# Patient Record
Sex: Female | Born: 1935 | Race: White | Hispanic: No | Smoking: Current every day smoker
Health system: Southern US, Community
[De-identification: ages and names within clinical notes are randomized; demographics above are authoritative.]

## PROBLEM LIST (undated history)

## (undated) DIAGNOSIS — M7551 Bursitis of right shoulder: Secondary | ICD-10-CM

## (undated) DIAGNOSIS — I10 Essential (primary) hypertension: Secondary | ICD-10-CM

## (undated) DIAGNOSIS — G629 Polyneuropathy, unspecified: Secondary | ICD-10-CM

## (undated) DIAGNOSIS — C801 Malignant (primary) neoplasm, unspecified: Secondary | ICD-10-CM

## (undated) DIAGNOSIS — J449 Chronic obstructive pulmonary disease, unspecified: Secondary | ICD-10-CM

---

## 2004-10-20 ENCOUNTER — Ambulatory Visit: Payer: Self-pay | Admitting: General Surgery

## 2005-11-16 ENCOUNTER — Ambulatory Visit: Payer: Self-pay | Admitting: General Surgery

## 2006-11-17 ENCOUNTER — Ambulatory Visit: Payer: Self-pay | Admitting: General Surgery

## 2006-11-22 ENCOUNTER — Ambulatory Visit: Payer: Self-pay | Admitting: General Surgery

## 2007-11-22 ENCOUNTER — Ambulatory Visit: Payer: Self-pay | Admitting: General Surgery

## 2008-12-27 ENCOUNTER — Ambulatory Visit: Payer: Self-pay | Admitting: General Surgery

## 2010-02-14 ENCOUNTER — Ambulatory Visit: Payer: Self-pay | Admitting: Ophthalmology

## 2010-02-25 ENCOUNTER — Ambulatory Visit: Payer: Self-pay | Admitting: Ophthalmology

## 2010-03-12 ENCOUNTER — Ambulatory Visit: Payer: Self-pay | Admitting: General Surgery

## 2011-01-12 ENCOUNTER — Ambulatory Visit: Payer: Self-pay | Admitting: Orthopedic Surgery

## 2011-01-13 ENCOUNTER — Ambulatory Visit: Payer: Self-pay | Admitting: Orthopedic Surgery

## 2011-02-19 ENCOUNTER — Encounter: Payer: Self-pay | Admitting: Orthopedic Surgery

## 2011-03-10 ENCOUNTER — Encounter: Payer: Self-pay | Admitting: Orthopedic Surgery

## 2011-03-18 ENCOUNTER — Ambulatory Visit: Payer: Self-pay | Admitting: General Surgery

## 2011-08-27 ENCOUNTER — Ambulatory Visit: Payer: Self-pay | Admitting: Orthopedic Surgery

## 2011-10-04 ENCOUNTER — Emergency Department: Payer: Self-pay | Admitting: Emergency Medicine

## 2011-10-24 ENCOUNTER — Emergency Department (HOSPITAL_COMMUNITY): Payer: Medicare Other

## 2011-10-24 ENCOUNTER — Observation Stay (HOSPITAL_COMMUNITY)
Admission: EM | Admit: 2011-10-24 | Discharge: 2011-10-26 | Disposition: A | Discharge status: Home / Self Care | Payer: Medicare Other | Attending: Internal Medicine | Admitting: Internal Medicine

## 2011-10-24 ENCOUNTER — Other Ambulatory Visit: Payer: Self-pay

## 2011-10-24 ENCOUNTER — Encounter: Payer: Self-pay | Admitting: *Deleted

## 2011-10-24 DIAGNOSIS — M79621 Pain in right upper arm: Secondary | ICD-10-CM | POA: Diagnosis present

## 2011-10-24 DIAGNOSIS — M79609 Pain in unspecified limb: Secondary | ICD-10-CM | POA: Insufficient documentation

## 2011-10-24 DIAGNOSIS — R627 Adult failure to thrive: Secondary | ICD-10-CM | POA: Insufficient documentation

## 2011-10-24 DIAGNOSIS — M719 Bursopathy, unspecified: Principal | ICD-10-CM | POA: Insufficient documentation

## 2011-10-24 DIAGNOSIS — M25519 Pain in unspecified shoulder: Secondary | ICD-10-CM | POA: Insufficient documentation

## 2011-10-24 DIAGNOSIS — I10 Essential (primary) hypertension: Secondary | ICD-10-CM | POA: Diagnosis not present

## 2011-10-24 DIAGNOSIS — G629 Polyneuropathy, unspecified: Secondary | ICD-10-CM

## 2011-10-24 DIAGNOSIS — G90511 Complex regional pain syndrome I of right upper limb: Secondary | ICD-10-CM

## 2011-10-24 DIAGNOSIS — J4489 Other specified chronic obstructive pulmonary disease: Secondary | ICD-10-CM | POA: Insufficient documentation

## 2011-10-24 DIAGNOSIS — C439 Malignant melanoma of skin, unspecified: Secondary | ICD-10-CM | POA: Insufficient documentation

## 2011-10-24 DIAGNOSIS — Z9181 History of falling: Secondary | ICD-10-CM | POA: Insufficient documentation

## 2011-10-24 DIAGNOSIS — R6251 Failure to thrive (child): Secondary | ICD-10-CM

## 2011-10-24 DIAGNOSIS — M67919 Unspecified disorder of synovium and tendon, unspecified shoulder: Principal | ICD-10-CM | POA: Insufficient documentation

## 2011-10-24 DIAGNOSIS — G5690 Unspecified mononeuropathy of unspecified upper limb: Secondary | ICD-10-CM | POA: Insufficient documentation

## 2011-10-24 DIAGNOSIS — M7551 Bursitis of right shoulder: Secondary | ICD-10-CM | POA: Diagnosis present

## 2011-10-24 DIAGNOSIS — Z85820 Personal history of malignant melanoma of skin: Secondary | ICD-10-CM | POA: Insufficient documentation

## 2011-10-24 DIAGNOSIS — J449 Chronic obstructive pulmonary disease, unspecified: Secondary | ICD-10-CM | POA: Diagnosis present

## 2011-10-24 HISTORY — DX: Bursitis of right shoulder: M75.51

## 2011-10-24 HISTORY — DX: Essential (primary) hypertension: I10

## 2011-10-24 HISTORY — DX: Malignant (primary) neoplasm, unspecified: C80.1

## 2011-10-24 HISTORY — DX: Polyneuropathy, unspecified: G62.9

## 2011-10-24 HISTORY — DX: Chronic obstructive pulmonary disease, unspecified: J44.9

## 2011-10-24 LAB — CBC
Hemoglobin: 16.2 g/dL — ABNORMAL HIGH (ref 12.0–15.0)
MCHC: 36 g/dL (ref 30.0–36.0)
Platelets: 239 10*3/uL (ref 150–400)
RBC: 4.76 MIL/uL (ref 3.87–5.11)

## 2011-10-24 LAB — COMPREHENSIVE METABOLIC PANEL
ALT: 17 U/L (ref 0–35)
AST: 16 U/L (ref 0–37)
Albumin: 4 g/dL (ref 3.5–5.2)
Alkaline Phosphatase: 77 U/L (ref 39–117)
BUN: 9 mg/dL (ref 6–23)
Chloride: 100 mEq/L (ref 96–112)
Potassium: 3.8 mEq/L (ref 3.5–5.1)
Sodium: 137 mEq/L (ref 135–145)
Total Bilirubin: 0.4 mg/dL (ref 0.3–1.2)

## 2011-10-24 LAB — URINALYSIS, ROUTINE W REFLEX MICROSCOPIC
Ketones, ur: NEGATIVE mg/dL
Leukocytes, UA: NEGATIVE
Protein, ur: NEGATIVE mg/dL
Urobilinogen, UA: 0.2 mg/dL (ref 0.0–1.0)

## 2011-10-24 LAB — DIFFERENTIAL
Basophils Absolute: 0 10*3/uL (ref 0.0–0.1)
Basophils Relative: 0 % (ref 0–1)
Monocytes Relative: 7 % (ref 3–12)
Neutro Abs: 7.2 10*3/uL (ref 1.7–7.7)
Neutrophils Relative %: 65 % (ref 43–77)

## 2011-10-24 LAB — CARDIAC PANEL(CRET KIN+CKTOT+MB+TROPI)
CK, MB: 3 ng/mL (ref 0.3–4.0)
Relative Index: INVALID (ref 0.0–2.5)
Total CK: 42 U/L (ref 7–177)
Troponin I: <0.3 ng/mL (ref <0.30)

## 2011-10-24 MED ORDER — KETOROLAC TROMETHAMINE 30 MG/ML IJ SOLN
30.0000 mg | Freq: Once | INTRAMUSCULAR | Status: AC
  Administered 2011-10-24: 30 mg via INTRAVENOUS
  Filled 2011-10-24: qty 1

## 2011-10-24 MED ORDER — ONDANSETRON HCL 4 MG/2ML IJ SOLN
4.0000 mg | Freq: Once | INTRAMUSCULAR | Status: AC
  Administered 2011-10-24: 4 mg via INTRAVENOUS
  Filled 2011-10-24: qty 2

## 2011-10-24 MED ORDER — MORPHINE SULFATE 2 MG/ML IJ SOLN
2.0000 mg | Freq: Once | INTRAMUSCULAR | Status: DC
  Filled 2011-10-24: qty 1

## 2011-10-24 NOTE — ED Notes (Signed)
Pt to Xray.

## 2011-10-24 NOTE — ED Notes (Signed)
Pt denies decrease in pain, but swelling to R hand is markedly decreased after toradol.  Pt also encouraged to elevated arm.

## 2011-10-24 NOTE — ED Notes (Signed)
Patient with right hand pain that is radiating to her upper right arm.  Patient had lymph node removed many years ago and last year her left arm started hurting on her left arm.  Patient went to her MD and she said that her "MD would not listen to her."  Patient's right arm looks swollen and her palm are red

## 2011-10-24 NOTE — ED Notes (Signed)
Report given and care transferred to [facility], [place].

## 2011-10-24 NOTE — ED Notes (Signed)
Family at bedside. 

## 2011-10-24 NOTE — ED Notes (Signed)
Pt with no improvement in her pain level after toradol.  Dr Adriana Simas notified.

## 2011-10-24 NOTE — Progress Notes (Signed)
*  PRELIMINARY RESULTS*   Right upper extremity venous Doppler completed.  There is no deep or superficial vein thrombosis.  Incidentally, there appears to be adaquate arterial flow throughout the arm to the hand.    [name] Meyer 10/24/2011, 4:56 PM

## 2011-10-24 NOTE — H&P (Signed)
PCP:   Elmo Putt, MD   Chief Complaint:   right upper arm for 6 month   HPI:  77 year old female with history of COPD , right upper arm melanoma sp removal ,she has this chronic pain on her right arm which is achy and pin like sensation sp what it looks like Radial nerve release done on March of this year, but per patient since then she continued to have worsening pain  To the degree pain affect her daily living activities , family has to help her , she lives alone, her pain progressively worsening to the degree cannot sleep at night.  Denies any weakness on her arm  Review of Systems:  The patient denies anorexia, fever, weight loss,, vision loss, decreased hearing, hoarseness, chest pain, syncope,positive for  dyspnea on exertion, peripheral edema,some  balance deficits with 2 falls in the recent past, denies hemoptysis, abdominal pain, melena, hematochezia, severe indigestion/heartburn, hematuria, incontinence, suspicious skin lesions, transient blindness, difficulty walking, depression, unusual weight change, abnormal bleeding, Past Medical History: Past Medical History  Diagnosis Date  . Hypertension    PAST SURGICAL HISTORY:cholecystectomy,partial hysterectomy, Radial nerve release?,melnoma  Medications: Prior to Admission medications   Medication Sig Start Date End Date Taking? Authorizing Provider  acetaminophen (TYLENOL) 500 MG tablet Take 500 mg by mouth every 6 (six) hours as needed. For pain    Yes Historical Provider, MD  benazepril (LOTENSIN) 20 MG tablet Take 10 mg by mouth daily.     Yes Historical Provider, MD  gabapentin (NEURONTIN) 100 MG capsule Take 100 mg by mouth at bedtime.     Yes Historical Provider, MD    Allergies:   Allergies  Allergen Reactions  . Other Nausea And Vomiting and Other (See Comments)    PAIN MEDICATIONS  REACTION: Patient states, "like I'm in another world"  . Penicillins Rash    Social History: on going tobacco abuse for more than  60 years , one pack per day, no drinking, lives alone ,has 3 grown up children Family History: History reviewed. No pertinent family history.  Physical Exam: Filed Vitals:   10/24/11 1557 10/24/11 1740 10/24/11 1819  BP: 167/74 157/81 168/74  Pulse: 95 87 87  Temp: 98 F (36.7 C)  98 F (36.7 C)  TempSrc: Oral  Oral  Resp: 20  20  SpO2: 94% 95% 96%   Shortness of breath. No JVP No lymph node, pupil equally reactive to light and accomodation Heart s1 ans s2 , no added murmur Lung : rales bilateral , no wheezing Abdomin: distended, BS present, no organomegaly Right upper arm : swelling on her fingers with limited wrist flexion , limited flexion of the fingers especially the lateral side, very tender to touch, no masson the armpit. Very limited shoulder movement secondary to pain rather than local weakness.   Labs on Admission:   [facility] 10/24/11 1912  NA 137  K 3.8  CL 100  CO2 26  GLUCOSE PENDING  BUN 9  CREATININE 0.60  CALCIUM 9.4  MG --  PHOS --    Basename 10/24/11 1912  AST 16  ALT 17  ALKPHOS 77  BILITOT 0.4  PROT 7.5  ALBUMIN 4.0   No results found for this basename: LIPASE:2,AMYLASE:2 in the last 72 hours  Basename 10/24/11 1912  WBC 11.1*  NEUTROABS 7.2  HGB 16.2*  HCT 45.0  MCV 94.5  PLT 239    Basename 10/24/11 1912  CKTOTAL 42  CKMB 3.0  CKMBINDEX --  TROPONINI <0.30   No results found for this basename: TSH,T4TOTAL,FREET3,T3FREE,THYROIDAB in the last 72 hours No results found for this basename: VITAMINB12:2,FOLATE:2,FERRITIN:2,TIBC:2,IRON:2,RETICCTPCT:2 in the last 72 hours  Radiological Exams on Admission: No results found.  Assessment/Plan 1- acute on chronic right upper arm pain: as per family previously investigated at [facility] hospital , no obvious cause and felt it could be related to radial nerve entrapments sp release, patient with swelling of her fingers , very sensitive to touch.limted hand flexion , will get record from  [place], ESR, RF, uric acid, MRI of the shoulder and CXR RULE OUT MASS, increase neurontin 100 mg po bid, may need to have nerve studies , please consider consulting hand surgeon in am , check vit B12.will treat supportively for now with ibuprofen and steroid to relieve local inflammation.looks likely secondary to radial nerve entrapments , unlikely carpal tunnel, check TSH, per daughter already have MRI OF cervical and thoraxic spine, will wait official record 2- COPD : NEBS  3-htn: HOME MEDICATION  Daquavion Catala I. 10/24/2011, 11:04 PM

## 2011-10-24 NOTE — ED Notes (Signed)
Family is afraid pt cannot care for herself at home with this kind of pain.  Social services may need to be contacted.

## 2011-10-24 NOTE — ED Notes (Signed)
Pt refused morphine saying it makes her feel" sick and drunk". Dr. Adriana Simas notified.

## 2011-10-24 NOTE — ED Provider Notes (Signed)
History     CSN: [id number] Arrival date & time: 10/24/2011  3:43 PM   First MD Initiated Contact with Patient 10/24/11 1825      Chief Complaint  Patient presents with  . Hand Pain    (Consider location/radiation/quality/duration/timing/severity/associated sxs/prior treatment) HPI..... and right upper extremity for one year getting worse the past 3 days.  Status post excision of lymph node in right axilla years ago. Family reports falling episodes lately and inability to take for your of her self. No chest pain, shortness of breath, fever, chills, dysuria.Marland Kitchen palpation makes pain worse. Level V caveat for urgent need for intervention  Past Medical History  Diagnosis Date  . Hypertension     History reviewed. No pertinent past surgical history.  History reviewed. No pertinent family history.  History  Substance Use Topics  . Smoking status: Never Smoker   . Smokeless tobacco: Not on file  . Alcohol Use: No    OB History    Grav Para Term Preterm Abortions TAB SAB Ect Mult Living                  Review of Systems  Unable to perform ROS   Allergies  Other and Penicillins  Home Medications   Current Outpatient Rx  Name Route Sig Dispense Refill  . ACETAMINOPHEN 500 MG PO TABS Oral Take 500 mg by mouth every 6 (six) hours as needed. For pain     . BENAZEPRIL HCL 20 MG PO TABS Oral Take 10 mg by mouth daily.      Marland Kitchen GABAPENTIN 100 MG PO CAPS Oral Take 100 mg by mouth at bedtime.        BP 168/74  Pulse 87  Temp(Src) 98 F (36.7 C) (Oral)  Resp 20  SpO2 96%  Physical Exam  Nursing note and vitals reviewed. Constitutional: She is oriented to person, place, and time. She appears well-developed and well-nourished.  HENT:  Head: Normocephalic and atraumatic.  Eyes: Conjunctivae and EOM are normal. Pupils are equal, round, and reactive to light.  Neck: Normal range of motion. Neck supple.  Cardiovascular: Normal rate and regular rhythm.   Pulmonary/Chest:  Effort normal and breath sounds normal.  Abdominal: Soft. Bowel sounds are normal.  Musculoskeletal: Normal range of motion.       Right upper extremity: Tender from medial proximal humerus to hand.  Full range of motion. Good pulses. hand warm.  Neurological: She is alert and oriented to person, place, and time.  Skin: Skin is warm and dry.       Bilateral palms are erythematous.  This is an old finding  Psychiatric: She has a normal mood and affect.    ED Course  Procedures (including critical care time)  Labs Reviewed  CBC - Abnormal; Notable for the following:    WBC 11.1 (*)    Hemoglobin 16.2 (*)    All other components within normal limits  COMPREHENSIVE METABOLIC PANEL - Abnormal; Notable for the following:    GFR calc non Af Amer 81 (*)    All other components within normal limits  APTT - Abnormal; Notable for the following:    aPTT 39 (*)    All other components within normal limits  DIFFERENTIAL  PROTIME-INR  CARDIAC PANEL(CRET KIN+CKTOT+MB+TROPI)  URINALYSIS, ROUTINE W REFLEX MICROSCOPIC   No results found.   1. Complex regional pain syndrome of right upper extremity   2. Failure to thrive       MDM  Venous  Doppler obtained which showed no acute findings. On Further examination, it appears that patient is having trouble caring for herself at home.  uncertain etiology of right upper extremity pain.         Donnetta Hutching, MD 10/24/11 2227

## 2011-10-25 ENCOUNTER — Emergency Department (HOSPITAL_COMMUNITY): Payer: Medicare Other

## 2011-10-25 ENCOUNTER — Inpatient Hospital Stay (HOSPITAL_COMMUNITY): Payer: Medicare Other

## 2011-10-25 ENCOUNTER — Encounter (HOSPITAL_COMMUNITY): Payer: Self-pay

## 2011-10-25 DIAGNOSIS — M7551 Bursitis of right shoulder: Secondary | ICD-10-CM | POA: Diagnosis present

## 2011-10-25 LAB — CBC
HCT: 43.3 % (ref 36.0–46.0)
Hemoglobin: 15 g/dL (ref 12.0–15.0)
MCH: 33.2 pg (ref 26.0–34.0)
MCHC: 34.6 g/dL (ref 30.0–36.0)
MCV: 95.6 fL (ref 78.0–100.0)
Platelets: 204 10*3/uL (ref 150–400)
RBC: 4.52 MIL/uL (ref 3.87–5.11)
RDW: 13.1 % (ref 11.5–15.5)
RDW: 13.1 % (ref 11.5–15.5)
WBC: 10.6 10*3/uL — ABNORMAL HIGH (ref 4.0–10.5)

## 2011-10-25 LAB — COMPREHENSIVE METABOLIC PANEL
Albumin: 3.5 g/dL (ref 3.5–5.2)
Alkaline Phosphatase: 72 U/L (ref 39–117)
BUN: 12 mg/dL (ref 6–23)
CO2: 26 mEq/L (ref 19–32)
Chloride: 99 mEq/L (ref 96–112)
Creatinine, Ser: 0.62 mg/dL (ref 0.50–1.10)
GFR calc Af Amer: >90 mL/min (ref >90)
GFR calc non Af Amer: 81 mL/min — ABNORMAL LOW (ref >90)
Glucose, Bld: 106 mg/dL — ABNORMAL HIGH (ref 70–99)
Potassium: 3.8 mEq/L (ref 3.5–5.1)
Total Bilirubin: 0.5 mg/dL (ref 0.3–1.2)

## 2011-10-25 LAB — CARDIAC PANEL(CRET KIN+CKTOT+MB+TROPI)
Relative Index: INVALID (ref 0.0–2.5)
Relative Index: INVALID (ref 0.0–2.5)
Total CK: 42 U/L (ref 7–177)
Total CK: 42 U/L (ref 7–177)
Total CK: 42 U/L (ref 7–177)
Troponin I: <0.3 ng/mL (ref <0.30)

## 2011-10-25 LAB — FERRITIN: Ferritin: 229 ng/mL (ref 10–291)

## 2011-10-25 LAB — VITAMIN B12: Vitamin B-12: 593 pg/mL (ref 211–911)

## 2011-10-25 LAB — RETICULOCYTES
RBC.: 4.52 MIL/uL (ref 3.87–5.11)
Retic Ct Pct: 1 % (ref 0.4–3.1)

## 2011-10-25 LAB — URIC ACID: Uric Acid, Serum: 4.3 mg/dL (ref 2.4–7.0)

## 2011-10-25 LAB — C-REACTIVE PROTEIN: CRP: 0.17 mg/dL — ABNORMAL LOW (ref <0.60)

## 2011-10-25 LAB — TSH: TSH: 1.706 u[IU]/mL (ref 0.350–4.500)

## 2011-10-25 LAB — CREATININE, SERUM: GFR calc non Af Amer: 79 mL/min — ABNORMAL LOW (ref >90)

## 2011-10-25 LAB — IRON AND TIBC: Saturation Ratios: 42 % (ref 20–55)

## 2011-10-25 MED ORDER — HEPARIN SODIUM (PORCINE) 5000 UNIT/ML IJ SOLN
5000.0000 [IU] | Freq: Three times a day (TID) | INTRAMUSCULAR | Status: DC
  Administered 2011-10-25 – 2011-10-26 (×5): 5000 [IU] via SUBCUTANEOUS
  Filled 2011-10-25 (×7): qty 1

## 2011-10-25 MED ORDER — IOHEXOL 300 MG/ML  SOLN
75.0000 mL | Freq: Once | INTRAMUSCULAR | Status: AC | PRN
  Administered 2011-10-25: 75 mL via INTRAVENOUS

## 2011-10-25 MED ORDER — ALBUTEROL SULFATE (5 MG/ML) 0.5% IN NEBU: 2.5000 mg | INHALATION_SOLUTION | Freq: Four times a day (QID) | RESPIRATORY_TRACT | Status: DC | PRN

## 2011-10-25 MED ORDER — ONDANSETRON HCL 4 MG/2ML IJ SOLN
4.0000 mg | Freq: Four times a day (QID) | INTRAMUSCULAR | Status: DC | PRN
  Administered 2011-10-25 (×3): 4 mg via INTRAVENOUS
  Filled 2011-10-25 (×3): qty 2

## 2011-10-25 MED ORDER — IBUPROFEN 400 MG PO TABS
400.0000 mg | ORAL_TABLET | Freq: Four times a day (QID) | ORAL | Status: DC | PRN
  Filled 2011-10-25: qty 1

## 2011-10-25 MED ORDER — IPRATROPIUM BROMIDE 0.02 % IN SOLN: 0.5000 mg | Freq: Four times a day (QID) | RESPIRATORY_TRACT | Status: DC | PRN

## 2011-10-25 MED ORDER — HYDROMORPHONE HCL PF 1 MG/ML IJ SOLN
0.5000 mg | Freq: Four times a day (QID) | INTRAMUSCULAR | Status: DC | PRN
  Administered 2011-10-25 (×3): 0.5 mg via INTRAVENOUS
  Filled 2011-10-25 (×3): qty 1

## 2011-10-25 MED ORDER — GABAPENTIN 100 MG PO CAPS
100.0000 mg | ORAL_CAPSULE | Freq: Two times a day (BID) | ORAL | Status: DC
  Administered 2011-10-25 – 2011-10-26 (×3): 100 mg via ORAL
  Filled 2011-10-25 (×4): qty 1

## 2011-10-25 MED ORDER — KETOROLAC TROMETHAMINE 30 MG/ML IJ SOLN
30.0000 mg | Freq: Two times a day (BID) | INTRAMUSCULAR | Status: AC
  Administered 2011-10-25: 30 mg via INTRAVENOUS
  Filled 2011-10-25 (×2): qty 1

## 2011-10-25 MED ORDER — METHYLPREDNISOLONE SODIUM SUCC 125 MG IJ SOLR
60.0000 mg | Freq: Two times a day (BID) | INTRAMUSCULAR | Status: DC
  Administered 2011-10-25: 60 mg via INTRAVENOUS
  Filled 2011-10-25 (×3): qty 0.96
  Filled 2011-10-25: qty 2

## 2011-10-25 MED ORDER — BENAZEPRIL HCL 10 MG PO TABS
10.0000 mg | ORAL_TABLET | Freq: Every day | ORAL | Status: DC
  Administered 2011-10-25 – 2011-10-26 (×2): 10 mg via ORAL
  Filled 2011-10-25 (×2): qty 1

## 2011-10-25 MED ORDER — ACETAMINOPHEN 500 MG PO TABS
500.0000 mg | ORAL_TABLET | Freq: Four times a day (QID) | ORAL | Status: DC | PRN
Start: 2011-10-25 — End: 2011-10-26
  Administered 2011-10-26: 500 mg via ORAL
  Filled 2011-10-25: qty 1

## 2011-10-25 NOTE — ED Notes (Signed)
Awaiting CT

## 2011-10-25 NOTE — ED Notes (Signed)
Patient transported to CT 

## 2011-10-25 NOTE — Progress Notes (Signed)
Patient ID: [name] Meyer, female   DOB: 1925/02/02, 77 y.o.   MRN: [id number] Subjective: No events overnight. Patient denies chest pain, shortness of breath, abdominal pain.   Objective:  Vital signs in last 24 hours:  Filed Vitals:   10/24/11 1819 10/24/11 2200 10/25/11 0140 10/25/11 0630  BP: 168/74 143/69 160/80 133/71  Pulse: 87 83 84 89  Temp: 98 F (36.7 C)  98 F (36.7 C) 97.1 F (36.2 C)  TempSrc: Oral     Resp: 20  16 18   SpO2: 96% 93% 93% 95%    Intake/Output from previous day:   Intake/Output Summary (Last 24 hours) at 10/25/11 1101 Last data filed at 10/25/11 0700  Gross per 24 hour  Intake    240 ml  Output      0 ml  Net    240 ml    Physical Exam: General: Alert, awake, oriented x to self and place, in no acute distress. HEENT: No bruits, no goiter. Moist mucous membranes, no scleral icterus, no conjunctival pallor. Heart: Regular rate and rhythm, without murmurs, rubs, gallops. Lungs: Clear to auscultation bilaterally. No wheezing, no rhonchi, no rales.  Abdomen: Soft, nontender, nondistended, positive bowel sounds. Extremities: Profound pain over her right upper extremity; no lower extremity edema, pulses palpable Neuro: Grossly intact, nonfocal.    Lab Results:  Basic Metabolic Panel:    Component Value Date/Time   NA 136 10/25/2011 0345   K 3.8 10/25/2011 0345   CL 99 10/25/2011 0345   CO2 26 10/25/2011 0345   BUN 12 10/25/2011 0345   CREATININE 0.65 10/25/2011 0345   CREATININE 0.62 10/25/2011 0345   GLUCOSE 106* 10/25/2011 0345   CALCIUM 9.2 10/25/2011 0345   CBC:    Component Value Date/Time   WBC 10.7* 10/25/2011 0345   WBC 10.6* 10/25/2011 0345   HGB 15.0 10/25/2011 0345   HGB 14.7 10/25/2011 0345   HCT 43.3 10/25/2011 0345   HCT 43.2 10/25/2011 0345   PLT 211 10/25/2011 0345   PLT 204 10/25/2011 0345   MCV 95.8 10/25/2011 0345   MCV 95.6 10/25/2011 0345   NEUTROABS 7.2 10/24/2011 1912   LYMPHSABS 3.1 10/24/2011 1912   MONOABS 0.8 10/24/2011 1912   EOSABS 0.0 10/24/2011 1912   BASOSABS 0.0 10/24/2011 1912      Lab 10/25/11 0345 10/24/11 1912  WBC 10.7*10.6* 11.1*  HGB 15.014.7 16.2*  HCT 43.343.2 45.0  PLT 211204 239  MCV 95.895.6 94.5  MCH 33.232.5 34.0  MCHC 34.634.0 36.0  RDW 13.113.1 13.0  LYMPHSABS -- 3.1  MONOABS -- 0.8  EOSABS -- 0.0  BASOSABS -- 0.0  BANDABS -- --    Lab 10/25/11 0345 10/24/11 1912  NA 136 137  K 3.8 3.8  CL 99 100  CO2 26 26  GLUCOSE 106* 99  BUN 12 9  CREATININE 0.650.62 0.60  CALCIUM 9.2 9.4  MG -- --    Lab 10/24/11 1912  INR 1.03  PROTIME --   Cardiac markers:  Lab 10/25/11 0925 10/25/11 0345 10/24/11 1912  CKMB 2.9 3.0 3.0  TROPONINI <0.30 <0.30 <0.30  MYOGLOBIN -- -- --   No components found with this basename: POCBNP:3 No results found for this or any previous visit (from the past 240 hour(s)).  Studies/Results: Dg Chest 2 View  10/24/2011 IMPRESSION: Vague  right upper lobe density may represent pneumonia.  Scarring and mass lesion are other possibilities.  Follow-up chest x-ray is suggested.   Ct Chest W Contrast  10/25/2011   IMPRESSION: Centrolobular emphysematous changes.  5 mm nodule along the minor fissure is favored to represent a benign lymph node. However, a follow-up chest CT at 6-12 months is recommended to document stability.  This recommendation follows the consensus statement: Guidelines for Management of Small Pulmonary Nodules Detected on CT Scans: A Statement from the Fleischner Society as published in Radiology 2005; 237:395-400. Online at: DietDisorder.cz.     Medications: Scheduled Meds:   . benazepril  10 mg Oral Daily  . gabapentin  100 mg Oral BID  . heparin  5,000 Units Subcutaneous Q8H  . ketorolac  30 mg Intravenous Once  . ketorolac  30 mg Intravenous BID  . methylPREDNISolone (SOLU-MEDROL) injection  60 mg Intravenous Q12H  . ondansetron  4 mg Intravenous  Once  . DISCONTD:  morphine injection  2 mg Intravenous Once   Continuous Infusions:  PRN Meds:.acetaminophen, albuterol, HYDROmorphone (DILAUDID) injection, ibuprofen, iohexol, ipratropium, ondansetron  Assessment/Plan:  Principal Problem:  *Pain of right upper arm - he is on patient history this is to be a chronic problem since 1970s; at present we are awaiting the results of MRI of the shoulder; orthopedics was contacted for her input on management and was recommended this is a chronic problem there is really not much to do except to try to obtain the records from wherever it did surgery in March ( questionable release of nerve entrapment)  Active Problems:   HTN (hypertension) - continue benazepril    COPD (chronic obstructive pulmonary disease) - stable at present, patient is saturating well on room air, continue nebulizer treatments as necessary    Neuropathy - unclear etiology, continue gabapentin    LOS: 1 day   [name] 10/25/2011, 11:01 AM

## 2011-10-26 MED ORDER — VITAMIN D (ERGOCALCIFEROL) 1.25 MG (50000 UNIT) PO CAPS
50000.0000 [IU] | ORAL_CAPSULE | ORAL | Status: DC
  Administered 2011-10-26: 50000 [IU] via ORAL
  Filled 2011-10-26: qty 1

## 2011-10-26 NOTE — Progress Notes (Signed)
Full assessment placed in chart. PT recommending HH or 24 hour care. Pt declining SNF. Dtr in room as well and agreeable to [facility]. CSW made CM aware. CSW is signing off. [place], [place] [phone]

## 2011-10-26 NOTE — Progress Notes (Signed)
MEDICARE-CERTIFIED HOME HEALTH AGENCIES [place]  Agencies that are Medicare-Certified and are affiliated with [facility]  Home Health Agency  Telephone Number Address  [facility]  [facility] has ownership interest in this company; however, you are under no obligation to use this agency. [phone] or [phone] [address] [place], [place]  [address]    Agencies that are Medicare-Certified and are not affiliated with [facility]   Home Health Agency  Telephone Number Address  [facility] [phone] Fax [phone] [address] [place], [place]  [address]  [facility] [phone] [address] [place], [place] [address]  [facility] [phone] [address] [place], [place]  [address]  [facility]  [phone] [address] [place], [place] [address]  [facility]  [phone] Fax [phone] [address] [place], [place]  [address]  [facility] [phone] or [phone] [address] [place], [place] [address]  [facility] [phone] [address] [place], [place] [address]  [facility] [phone]  [address] [place], [place] [address]  [facility] [phone] or [phone]  [address] [place], [place]  [address]  [facility] [phone] Fax [phone] [address] [place] [address] [place], [place]  [address]  [facility]  [phone]  Fax [phone] [address], [address] [place],   [address]    

## 2011-10-26 NOTE — Progress Notes (Signed)
Physical Therapy Evaluation Patient Details Name: [name] Meyer MRN: [id number] DOB: 07-22-1925 Today's Date: 10/26/2011  Problem List:  Patient Active Problem List  Diagnoses  . HTN (hypertension)  . Pain of right upper arm  . Melanoma  . COPD (chronic obstructive pulmonary disease)  . Neuropathy  . Bursitis of shoulder, right    Past Medical History:  Past Medical History  Diagnosis Date  . Hypertension   . Cancer   . Bursitis of right shoulder   . COPD (chronic obstructive pulmonary disease)   . Neuropathy    Past Surgical History: History reviewed. No pertinent past surgical history.  PT Assessment/Plan/Recommendation PT Assessment Clinical Impression Statement: Pt presents with a medical diagnosis of right upper extremity pain and swelling along with the following impairments/deficits and therapy diagnosis listed below. Pt presents as a fall risk. Educated pt on importance of an assistive device for safety at home, pt refused.  PT Recommendation/Assessment: Patient will need skilled PT in the acute care venue PT Problem List: Decreased balance;Decreased safety awareness;Pain Barriers to Discharge: Decreased caregiver support Barriers to Discharge Comments: family visits intermittently PT Therapy Diagnosis : Difficulty walking PT Plan PT Frequency: Min 3X/week PT Treatment/Interventions: DME instruction;Gait training;Stair training;Functional mobility training;Therapeutic activities;Therapeutic exercise;Balance training;Patient/family education PT Recommendation Follow Up Recommendations: Home health PT;24 hour supervision/assistance Equipment Recommended: Other (comment) (pt refused equipment) PT Goals  Acute Rehab PT Goals PT Goal Formulation: With patient Time For Goal Achievement: 7 days Pt will go Supine/Side to Sit: with modified independence PT Goal: Supine/Side to Sit - Progress: Progressing toward goal Pt will go Sit to Supine/Side: with modified  independence PT Goal: Sit to Supine/Side - Progress: Progressing toward goal Pt will go Sit to Stand: with modified independence PT Goal: Sit to Stand - Progress: Progressing toward goal Pt will go Stand to Sit: with modified independence PT Goal: Stand to Sit - Progress: Progressing toward goal Pt will Transfer Bed to Chair/Chair to Bed: with modified independence PT Transfer Goal: Bed to Chair/Chair to Bed - Progress: Progressing toward goal Pt will Ambulate: >150 feet;with supervision;with least restrictive assistive device PT Goal: Ambulate - Progress: Progressing toward goal Pt will Go Up / Down Stairs: 1-2 stairs;with supervision PT Goal: Up/Down Stairs - Progress: Progressing toward goal Pt will Perform Home Exercise Program: Independently PT Goal: Perform Home Exercise Program - Progress: Progressing toward goal  PT Evaluation Precautions/Restrictions  Restrictions Weight Bearing Restrictions: No Prior Functioning  Home Living Lives With: Alone Receives Help From: Family (children) Type of Home: House Home Layout: One level Home Access: Stairs to enter Entrance Stairs-Rails: Can reach both Entrance Stairs-Number of Steps: 4 Bathroom Shower/Tub: Tub/shower unit (does not use because she cannot transfer) Bathroom Toilet: Standard Home Adaptive Equipment: None Prior Function Level of Independence: Independent with basic ADLs;Independent with homemaking with ambulation;Independent with gait;Independent with transfers Able to Take Stairs?: Yes Driving: No Vocation: Retired Producer, television/film/video: Awake/alert Overall Cognitive Status: Appears within functional limits for tasks assessed Sensation/Coordination Sensation Light Touch: Appears Intact Extremity Assessment RLE Assessment RLE Assessment: Within Functional Limits LLE Assessment LLE Assessment: Within Functional Limits Mobility (including Balance) Bed Mobility Bed Mobility:  No Transfers Transfers: Yes Sit to Stand: 4: Min assist;With upper extremity assist;From chair/3-in-1;From bed (LUE assist only) Sit to Stand Details (indicate cue type and reason): VC for hand placement for safety. Assist for stability into standing Stand to Sit: 4: Min assist;With upper extremity assist;To chair/3-in-1 (LUE assist only) Stand to Sit Details: VC for hand placement. Controlled  descent Ambulation/Gait Ambulation/Gait: Yes Ambulation/Gait Assistance: 3: Mod assist Ambulation/Gait Assistance Details (indicate cue type and reason): VC for proper sequencing. Pt with slight loss of balance thruoghout ambulation. While observing gait in room, pt held onto all furniture and walls for stability. Educated pt on importance of an assitive device and discussed options with her. Pt refused all assistive devices  Ambulation Distance (Feet): 200 Feet Assistive device: 1 person hand held assist (pt refused AD) Gait Pattern: Trunk flexed Gait velocity: Slow cadence Stairs: Yes Stairs Assistance: 3: Mod assist Stairs Assistance Details (indicate cue type and reason): Mod assist for stability Stair Management Technique: No rails;Forwards Number of Stairs: 2   Balance Balance Assessed: Yes Dynamic Standing Balance Dynamic Standing - Balance Support: Left upper extremity supported Dynamic Standing - Level of Assistance: 3: Mod assist Dynamic Standing - Balance Activities: Other (comment) (Ambulation) Dynamic Standing - Comments: Pt with slight loss of balance with movement. Discussed AD for fall risk, pt refused. Exercise    End of Session PT - End of Session Equipment Utilized During Treatment: Gait belt Activity Tolerance: Patient tolerated treatment well Patient left: in chair;with call bell in reach Nurse Communication: Mobility status for transfers;Mobility status for ambulation General Behavior During Session: [facility] for tasks performed Cognition: [facility] for tasks performed  Milana Kidney 10/26/2011, 11:53 AM  10/26/2011 Milana Kidney DPT PAGER: [phone] OFFICE: [phone]

## 2011-10-26 NOTE — Progress Notes (Signed)
   CARE MANAGEMENT NOTE 10/26/2011  Patient:  [name] Meyer, [name] Meyer   Account Number:  [id number]  Date Initiated:  10/26/2011  Documentation initiated by:  [name] Meyer Assessment:   77 yr-old female adm with acute on chronic (R) upper arm pain; lives alone, two daughters live in area and are supportive.     Anticipated DC Date:  10/26/2011   Anticipated DC Plan:  HOME W HOME HEALTH SERVICES      DC Planning Services  CM consult      [facility] Choice  HOME HEALTH   Choice offered to / List presented to:  [name]        HH arranged  HH-2 PT  HH-3 OT      [facility] agency  [facility]   Status of service:  Completed, signed off  Discharge Disposition:  HOME W HOME HEALTH SERVICES  Comments:  PCP:  Dr. Yates Decamp Meyer  10/26/11 1430 [name] Feltman RN MSN CCM PT/OT recommend home health PT, OT.  Discussed with pt who agrees to services.  Provided list of [facility] agencies, referral made per pt choice, list of agencies placed in chart.

## 2011-10-26 NOTE — Progress Notes (Signed)
Utilization Review Completed.Delecia Vastine T12/17/2012   

## 2011-10-26 NOTE — Progress Notes (Signed)
Occupational Therapy Evaluation Patient Details Name: [name] Meyer MRN: [id number] DOB: Dec 21, 1924 Today's Date: 10/26/2011  Problem List:  Patient Active Problem List  Diagnoses  . HTN (hypertension)  . Pain of right upper arm  . Melanoma  . COPD (chronic obstructive pulmonary disease)  . Neuropathy  . Bursitis of shoulder, right    Past Medical History:  Past Medical History  Diagnosis Date  . Hypertension   . Cancer   . Bursitis of right shoulder   . COPD (chronic obstructive pulmonary disease)   . Neuropathy    Past Surgical History: History reviewed. No pertinent past surgical history.  OT Assessment/Plan/Recommendation OT Assessment Clinical Impression Statement: Pt will benefit from acute OT. Pt with decreased I with ADLs/functional transfers due to pain and decreased balance.   OT Recommendation/Assessment: Patient will need skilled OT in the acute care venue OT Problem List: Decreased activity tolerance;Impaired balance (sitting and/or standing);Decreased coordination;Decreased safety awareness;Decreased knowledge of use of DME or AE;Pain;Impaired UE functional use OT Therapy Diagnosis : Acute pain OT Plan OT Frequency: Min 2X/week OT Treatment/Interventions: Self-care/ADL training;DME and/or AE instruction;Therapeutic activities;Patient/family education;Balance training OT Recommendation Follow Up Recommendations: Home health OT Equipment Recommended: None recommended by OT Individuals Consulted Consulted and Agree with Results and Recommendations: Patient OT Goals Acute Rehab OT Goals OT Goal Formulation: With patient Time For Goal Achievement: 7 days ADL Goals Pt Will Perform Upper Body Dressing: with modified independence;Sitting, bed;Sitting, chair;Unsupported ADL Goal: Upper Body Dressing - Progress: Other (comment) Pt Will Perform Lower Body Dressing: with modified independence;Sit to stand from chair;Sit to stand from bed;Unsupported ADL Goal: Lower  Body Dressing - Progress: Other (comment) Pt Will Transfer to Toilet: with modified independence;Ambulation;Regular height toilet ADL Goal: Toilet Transfer - Progress: Other (comment)  OT Evaluation Precautions/Restrictions  Restrictions Weight Bearing Restrictions: No Prior Functioning Home Living Lives With: Alone Receives Help From: Family (children) Type of Home: House Home Layout: One level Home Access: Stairs to enter Entrance Stairs-Rails: Can reach both Entrance Stairs-Number of Steps: 4 Bathroom Shower/Tub: Tub/shower unit (does not use because she cannot transfer) Bathroom Toilet: Standard Home Adaptive Equipment: None Prior Function Level of Independence: Independent with basic ADLs;Independent with homemaking with ambulation;Independent with gait;Independent with transfers Able to Take Stairs?: Yes Driving: No Vocation: Retired ADL ADL Eating/Feeding: Simulated;Set up Where Assessed - Eating/Feeding: Chair Grooming: Performed;Wash/dry hands;Modified independent Where Assessed - Grooming: Standing at sink Toilet Transfer: Performed;Moderate assistance Toilet Transfer Details (indicate cue type and reason): Mod assist for safety as pt demonstrated loss of balance while ambulating throughout room.   Toilet Transfer Method: Proofreader: Regular height toilet Toileting - Clothing Manipulation: Performed;Modified independent Where Assessed - Toileting Clothing Manipulation: Sit to stand from 3-in-1 or toilet Toileting - Hygiene: Performed;Modified independent Where Assessed - Toileting Hygiene: Sit on 3-in-1 or toilet Equipment Used: Other (comment) ADL Comments: Pt performed ADLs without use of RUE due to pain. Vision/Perception    Cognition Cognition Arousal/Alertness: Awake/alert Overall Cognitive Status: Appears within functional limits for tasks assessed Sensation/Coordination Sensation Light Touch: Appears Intact Additional  Comments: Pt with c/o "pins and needles through entire RUE" Coordination Fine Motor Movements are Fluid and Coordinated: No Extremity Assessment RUE Assessment RUE Assessment: Exceptions to [facility] RUE Strength RUE Overall Strength: Unable to assess;Due to pain LUE Assessment LUE Assessment: Within Functional Limits Mobility  Bed Mobility Bed Mobility: No Transfers Transfers: Yes Sit to Stand: 4: Min assist;With upper extremity assist;From chair/3-in-1;From bed Sit to Stand Details (indicate cue type and reason):  vc for hand placement for safety. assist for stability into standing. Stand to Sit: 4: Min assist;With upper extremity assist;To chair/3-in-1 Stand to Sit Details: vc for hand placement. controlled descent Exercises   End of Session OT - End of Session Activity Tolerance: Treatment limited secondary to agitation (Pt unwilling to participate in session due to desire to leav) Patient left: in chair;with call bell in reach General Behavior During Session: [facility] for tasks performed Cognition: [facility] for tasks performed   Cipriano Mile 10/26/2011, 12:22 PM  10/26/2011 Cipriano Mile OTR/L Pager [phone] Office [phone]

## 2011-10-26 NOTE — Progress Notes (Addendum)
Pt admitted over the weekend for R arm/shoulder and hand pain.  ?? RUE bursiits. Pt has decreased ROM of RUE related to pain. +cms. Pt ambulates with unsteady gait. Pt noted to be up in room without assistance. Pt instructed due to her unsteadiness, she is not to be up in room without assistanceto prevent possibility of falls. Pt  Currently sitting in recliner. Pt reinstructed call bell usage. Pt is alert and oriented x 3 but forgetful. Pt is not cooperative.  Pt noted to occasionally curse staff. Pt states, "I don't care. I am going home". Pt is waiting to be seen via orthopedic MD regarding RUE pain. Pt instructed while she is in the hospital to please call for assistance up in room to prevent falls. Pt verb understanding and agrees to comply. Pt offered a Recruitment consultant. Pt states, "I will call"  Cause I don't want a sitter. Pt repts that she has a past medical history of urinary incontinence or "dribbling of urine". Pt repts that it is "normal" for her to dribble urine and she wears pad. Pt, per rept, has a past medical history of COPD, chronic shoulder R pain, and neuropathy. Pt rates her RUE pain a "10" on scale of 1-10. Pt refuses pain medications. Pt paranoid about anything pill form staff is giving her. Pt offered Tylenol if she "doesn't like how the narcotics make her her feel" .  Pt refuses Tylenol. Pt states, "That won't help me." Pt bil hands noted to be red. Pt states, "My hands are always red." Lungs CTA but noted to be diminished throughout. Pt repts nonprod cough. Pt denies cardiac or resp distress and no s/sx of such. Vital signs stable. Pt repts intermittent nausea but no vomiting yesterday that was relieved with anti emetics per order yesterday. Pt denies nausea or vomiting today. Pt repts that she occasionally is SOB with increased pain. Pt repts, "I have been like that." Pt can turn self and floats heels. Pt repts LBM 12/15. Pt repts passing gas and tolerates diet "fair".

## 2011-10-26 NOTE — Discharge Summary (Signed)
PCP:  Elmo Putt, MD   DOA:  10/24/2011  3:43 PM  Chief Complaint:  Pain in right shoulder  HPI: Patient is an elderly female with history of neuropathy, chronic pain in the right shoulder with unknown etiology who came [place] to [facility] for profound pain in the right shoulder. As per patient pain has been there for more than 30 years and no one could find out what is the etiology. Pain is described as needle prick on the fingertips and occasionally the same feeling throughout the whole arm. She has never experienced these symptoms in left arm. Patient had nerve entrapment and nerve released by Dr. Rosita Kea as per patient in march, 2012.  Allergies: Allergies  Allergen Reactions  . Other Nausea And Vomiting and Other (See Comments)    PAIN MEDICATIONS  REACTION: Patient states, "like I'm in another world"  . Penicillins Rash    Prior to Admission medications   Medication Sig Start Date End Date Taking? Authorizing Provider  acetaminophen (TYLENOL) 500 MG tablet Take 500 mg by mouth every 6 (six) hours as needed. For pain    Yes Historical Provider, MD  benazepril (LOTENSIN) 20 MG tablet Take 10 mg by mouth daily.     Yes Historical Provider, MD  gabapentin (NEURONTIN) 100 MG capsule Take 100 mg by mouth at bedtime.     Yes Historical Provider, MD    Past Medical History  Diagnosis Date  . Hypertension   . Cancer   . Bursitis of right shoulder   . COPD (chronic obstructive pulmonary disease)   . Neuropathy     History reviewed. No pertinent past surgical history.  Social History:  reports that she has been smoking.  She does not have any smokeless tobacco history on file. She reports that she does not drink alcohol or use illicit drugs.  History reviewed. No pertinent family history.  Review of Systems:  Constitutional: Denies fever, chills, diaphoresis, appetite change and fatigue.  HEENT: Denies photophobia, eye pain, redness, hearing loss, ear pain,  congestion, sore throat, rhinorrhea, sneezing, mouth sores, trouble swallowing, neck pain, neck stiffness and tinnitus.   Respiratory: Denies SOB, DOE, cough, chest tightness,  and wheezing.   Cardiovascular: Denies chest pain, palpitations and leg swelling.  Gastrointestinal: Denies nausea, vomiting, abdominal pain, diarrhea, constipation, blood in stool and abdominal distention.  Genitourinary: Denies dysuria, urgency, frequency, hematuria, flank pain and difficulty urinating.  Musculoskeletal: Denies myalgias, back pain, joint swelling, arthralgias and gait problem; pain in right shoulder Skin: Denies pallor, rash and wound.  Neurological: Denies dizziness, seizures, syncope, weakness, light-headedness, numbness and headaches.  Hematological: Denies adenopathy. Easy bruising, personal or family bleeding history  Psychiatric/Behavioral: Denies suicidal ideation, mood changes, confusion, nervousness, sleep disturbance and agitation   Physical Exam:  Filed Vitals:   10/25/11 0630 10/25/11 1400 10/25/11 2150 10/26/11 0640  BP: 133/71 130/70 145/68 136/70  Pulse: 89 86 81 86  Temp: 97.1 F (36.2 C) 98.8 F (37.1 C) 98.2 F (36.8 C) 98.5 F (36.9 C)  TempSrc:  Oral    Resp: 18 18 16 16   SpO2: 95% 90% 92% 94%    Constitutional: Vital signs reviewed.  Patient is a well-developed and well-nourished in no acute distress and cooperative with exam. Alert and oriented x3.  Head: Normocephalic and atraumatic Ear: TM normal bilaterally Mouth: no erythema or exudates, MMM Eyes: PERRL, EOMI, conjunctivae normal, No scleral icterus.  Neck: Supple, Trachea midline normal ROM, No JVD, mass, thyromegaly, or carotid bruit  present.  Cardiovascular: RRR, S1 normal, S2 normal, no MRG, pulses symmetric and intact bilaterally Pulmonary/Chest: CTAB, no wheezes, rales, or rhonchi Abdominal: Soft. Non-tender, non-distended, bowel sounds are normal, no masses, organomegaly, or guarding present.  GU: no CVA  tenderness Musculoskeletal: No joint deformities, erythema, or stiffness, ROM full and no nontender Ext: no edema and no cyanosis, pulses palpable bilaterally (DP and PT); pain in the right shoulder Hematology: no cervical, inginal, or axillary adenopathy.  Neurological: A&O x3, Strenght is normal and symmetric bilaterally, cranial nerve II-XII are grossly intact, no focal motor deficit, sensory intact to light touch bilaterally.  Skin: Warm, dry and intact. No rash, cyanosis, or clubbing.  Psychiatric: Normal mood and affect. speech and behavior is normal. Judgment and thought content normal. Cognition and memory are normal.   Labs on Admission:  Results for orders placed during the hospital encounter of 10/24/11 (from the past 48 hour(s))  CBC     Status: Abnormal   Collection Time   10/24/11  7:12 PM      Component Value Range Comment   WBC 11.1 (*) 4.0 - 10.5 (K/uL)    RBC 4.76  3.87 - 5.11 (MIL/uL)    Hemoglobin 16.2 (*) 12.0 - 15.0 (g/dL)    HCT 96.0  45.4 - 09.8 (%)    MCV 94.5  78.0 - 100.0 (fL)    MCH 34.0  26.0 - 34.0 (pg)    MCHC 36.0  30.0 - 36.0 (g/dL)    RDW 11.9  14.7 - 82.9 (%)    Platelets 239  150 - 400 (K/uL)   DIFFERENTIAL     Status: Normal   Collection Time   10/24/11  7:12 PM      Component Value Range Comment   Neutrophils Relative 65  43 - 77 (%)    Neutro Abs 7.2  1.7 - 7.7 (K/uL)    Lymphocytes Relative 28  12 - 46 (%)    Lymphs Abs 3.1  0.7 - 4.0 (K/uL)    Monocytes Relative 7  3 - 12 (%)    Monocytes Absolute 0.8  0.1 - 1.0 (K/uL)    Eosinophils Relative 0  0 - 5 (%)    Eosinophils Absolute 0.0  0.0 - 0.7 (K/uL)    Basophils Relative 0  0 - 1 (%)    Basophils Absolute 0.0  0.0 - 0.1 (K/uL)   COMPREHENSIVE METABOLIC PANEL     Status: Abnormal   Collection Time   10/24/11  7:12 PM      Component Value Range Comment   Sodium 137  135 - 145 (mEq/L)    Potassium 3.8  3.5 - 5.1 (mEq/L)    Chloride 100  96 - 112 (mEq/L)    CO2 26  19 - 32 (mEq/L)     Glucose, Bld 99  70 - 99 (mg/dL)    BUN 9  6 - 23 (mg/dL)    Creatinine, Ser 5.62  0.50 - 1.10 (mg/dL)    Calcium 9.4  8.4 - 10.5 (mg/dL)    Total Protein 7.5  6.0 - 8.3 (g/dL)    Albumin 4.0  3.5 - 5.2 (g/dL)    AST 16  0 - 37 (U/L)    ALT 17  0 - 35 (U/L)    Alkaline Phosphatase 77  39 - 117 (U/L)    Total Bilirubin 0.4  0.3 - 1.2 (mg/dL)    GFR calc non Af Amer 81 (*) >90 (mL/min)  GFR calc Af Amer >90  >90 (mL/min)   PROTIME-INR     Status: Normal   Collection Time   10/24/11  7:12 PM      Component Value Range Comment   Prothrombin Time 13.7  11.6 - 15.2 (seconds)    INR 1.03  0.00 - 1.49    APTT     Status: Abnormal   Collection Time   10/24/11  7:12 PM      Component Value Range Comment   aPTT 39 (*) 24 - 37 (seconds)   CARDIAC PANEL(CRET KIN+CKTOT+MB+TROPI)     Status: Normal   Collection Time   10/24/11  7:12 PM      Component Value Range Comment   Total CK 42  7 - 177 (U/L)    CK, MB 3.0  0.3 - 4.0 (ng/mL)    Troponin I <0.30  <0.30 (ng/mL)    Relative Index RELATIVE INDEX IS INVALID  0.0 - 2.5    URINALYSIS, ROUTINE W REFLEX MICROSCOPIC     Status: Abnormal   Collection Time   10/24/11 11:07 PM      Component Value Range Comment   Color, Urine STRAW (*) YELLOW     APPearance CLEAR  CLEAR     Specific Gravity, Urine 1.007  1.005 - 1.030     pH 7.0  5.0 - 8.0     Glucose, UA NEGATIVE  NEGATIVE (mg/dL)    Hgb urine dipstick NEGATIVE  NEGATIVE     Bilirubin Urine NEGATIVE  NEGATIVE     Ketones, ur NEGATIVE  NEGATIVE (mg/dL)    Protein, ur NEGATIVE  NEGATIVE (mg/dL)    Urobilinogen, UA 0.2  0.0 - 1.0 (mg/dL)    Nitrite NEGATIVE  NEGATIVE     Leukocytes, UA NEGATIVE  NEGATIVE  MICROSCOPIC NOT DONE ON URINES WITH NEGATIVE PROTEIN, BLOOD, LEUKOCYTES, NITRITE, OR GLUCOSE <1000 mg/dL.  URIC ACID     Status: Normal   Collection Time   10/25/11  3:45 AM      Component Value Range Comment   Uric Acid, Serum 4.3  2.4 - 7.0 (mg/dL)   SEDIMENTATION RATE     Status:  Normal   Collection Time   10/25/11  3:45 AM      Component Value Range Comment   Sed Rate 10  0 - 22 (mm/hr)   C-REACTIVE PROTEIN     Status: Abnormal   Collection Time   10/25/11  3:45 AM      Component Value Range Comment   CRP 0.17 (*) <0.60 (mg/dL)   VITAMIN Z61     Status: Normal   Collection Time   10/25/11  3:45 AM      Component Value Range Comment   Vitamin B-12 593  211 - 911 (pg/mL)   FOLATE     Status: Normal   Collection Time   10/25/11  3:45 AM      Component Value Range Comment   Folate >20.0     IRON AND TIBC     Status: Normal   Collection Time   10/25/11  3:45 AM      Component Value Range Comment   Iron 129  42 - 135 (ug/dL)    TIBC 096  045 - 409 (ug/dL)    Saturation Ratios 42  20 - 55 (%)    UIBC 177  125 - 400 (ug/dL)   FERRITIN     Status: Normal   Collection Time   10/25/11  3:45 AM      Component Value Range Comment   Ferritin 229  10 - 291 (ng/mL)   RETICULOCYTES     Status: Normal   Collection Time   10/25/11  3:45 AM      Component Value Range Comment   Retic Ct Pct 1.0  0.4 - 3.1 (%)    RBC. 4.52  3.87 - 5.11 (MIL/uL)    Retic Count, Manual 45.2  19.0 - 186.0 (K/uL)   TSH     Status: Normal   Collection Time   10/25/11  3:45 AM      Component Value Range Comment   TSH 1.706  0.350 - 4.500 (uIU/mL)   CARDIAC PANEL(CRET KIN+CKTOT+MB+TROPI)     Status: Normal   Collection Time   10/25/11  3:45 AM      Component Value Range Comment   Total CK 42  7 - 177 (U/L)    CK, MB 3.0  0.3 - 4.0 (ng/mL)    Troponin I <0.30  <0.30 (ng/mL)    Relative Index RELATIVE INDEX IS INVALID  0.0 - 2.5    CBC     Status: Abnormal   Collection Time   10/25/11  3:45 AM      Component Value Range Comment   WBC 10.7 (*) 4.0 - 10.5 (K/uL)    RBC 4.52  3.87 - 5.11 (MIL/uL)    Hemoglobin 15.0  12.0 - 15.0 (g/dL)    HCT 04.5  40.9 - 81.1 (%)    MCV 95.8  78.0 - 100.0 (fL)    MCH 33.2  26.0 - 34.0 (pg)    MCHC 34.6  30.0 - 36.0 (g/dL)    RDW 91.4  78.2 -  95.6 (%)    Platelets 211  150 - 400 (K/uL)   CREATININE, SERUM     Status: Abnormal   Collection Time   10/25/11  3:45 AM      Component Value Range Comment   Creatinine, Ser 0.65  0.50 - 1.10 (mg/dL)    GFR calc non Af Amer 79 (*) >90 (mL/min)    GFR calc Af Amer >90  >90 (mL/min)   COMPREHENSIVE METABOLIC PANEL     Status: Abnormal   Collection Time   10/25/11  3:45 AM      Component Value Range Comment   Sodium 136  135 - 145 (mEq/L)    Potassium 3.8  3.5 - 5.1 (mEq/L)    Chloride 99  96 - 112 (mEq/L)    CO2 26  19 - 32 (mEq/L)    Glucose, Bld 106 (*) 70 - 99 (mg/dL)    BUN 12  6 - 23 (mg/dL)    Creatinine, Ser 2.13  0.50 - 1.10 (mg/dL)    Calcium 9.2  8.4 - 10.5 (mg/dL)    Total Protein 7.0  6.0 - 8.3 (g/dL)    Albumin 3.5  3.5 - 5.2 (g/dL)    AST 14  0 - 37 (U/L)    ALT 14  0 - 35 (U/L)    Alkaline Phosphatase 72  39 - 117 (U/L)    Total Bilirubin 0.5  0.3 - 1.2 (mg/dL)    GFR calc non Af Amer 81 (*) >90 (mL/min)    GFR calc Af Amer >90  >90 (mL/min)   CBC     Status: Abnormal   Collection Time   10/25/11  3:45 AM      Component Value Range Comment   WBC  10.6 (*) 4.0 - 10.5 (K/uL)    RBC 4.52  3.87 - 5.11 (MIL/uL)    Hemoglobin 14.7  12.0 - 15.0 (g/dL)    HCT 11.9  14.7 - 82.9 (%)    MCV 95.6  78.0 - 100.0 (fL)    MCH 32.5  26.0 - 34.0 (pg)    MCHC 34.0  30.0 - 36.0 (g/dL)    RDW 56.2  13.0 - 86.5 (%)    Platelets 204  150 - 400 (K/uL)   CARDIAC PANEL(CRET KIN+CKTOT+MB+TROPI)     Status: Normal   Collection Time   10/25/11  9:25 AM      Component Value Range Comment   Total CK 42  7 - 177 (U/L)    CK, MB 2.9  0.3 - 4.0 (ng/mL)    Troponin I <0.30  <0.30 (ng/mL)    Relative Index RELATIVE INDEX IS INVALID  0.0 - 2.5    CARDIAC PANEL(CRET KIN+CKTOT+MB+TROPI)     Status: Normal   Collection Time   10/25/11  3:55 PM      Component Value Range Comment   Total CK 42  7 - 177 (U/L)    CK, MB 2.5  0.3 - 4.0 (ng/mL)    Troponin I <0.30  <0.30 (ng/mL)    Relative  Index RELATIVE INDEX IS INVALID  0.0 - 2.5      Radiological Exams on Admission: Dg Chest 2 View  10/24/2011  *RADIOLOGY REPORT*  Clinical Data: Failure to thrive.  Short of breath  CHEST - 2 VIEW  Comparison: None.  Findings: Heart size is normal.  Negative for heart failure.  Vague right upper lobe density may represent pneumonia.  This could also represent scar or mass lesion.  Follow-up chest x-ray or chest CT is suggested.  Negative for pleural effusion  IMPRESSION: Vague  right upper lobe density may represent pneumonia.  Scarring and mass lesion are other possibilities.  Follow-up chest x-ray is suggested.  Original Report Authenticated By: Camelia Phenes, M.D.   Ct Chest W Contrast  10/25/2011  *RADIOLOGY REPORT*  Clinical Data: COPD, questionable nodule on the recent chest x-ray  CT CHEST WITH CONTRAST  Technique:  Multidetector CT imaging of the chest was performed following the standard protocol during bolus administration of intravenous contrast.  Contrast: 75mL OMNIPAQUE IOHEXOL 300 MG/ML IV SOLN  Comparison: 10/24/2011 chest radiograph  Findings: No main branch pulmonary arterial filling defect.  Normal caliber aorta.  Scattered atherosclerotic calcification.  Normal heart size.  Coronary artery calcification.  Trace pericardial fluid versus thickening.  No intrathoracic lymphadenopathy.  Normal thyroid gland.  Mild centrolobular emphysematous changes.  Mild right greater than left lung base scarring versus atelectasis.  There is a 5 mm nodule along the minor fissure on image 33.  No pneumothorax.  Central airways are patent.  Limited images through the upper abdomen demonstrate multiple hepatic and renal cysts.  Bilateral adrenal gland hyperplasia.  Multilevel degenerative changes of the imaged spine. No acute or aggressive appearing osseous lesion.  There is compression deformity of a lower thoracic vertebral body which appears chronic.  IMPRESSION: Centrolobular emphysematous changes.  5  mm nodule along the minor fissure is favored to represent a benign lymph node. However, a follow-up chest CT at 6-12 months is recommended to document stability.  This recommendation follows the consensus statement: Guidelines for Management of Small Pulmonary Nodules Detected on CT Scans: A Statement from the Fleischner Society as published in Radiology 2005; 237:395-400. Online at:  [email or web address].cz.  Original Report Authenticated By: Waneta Martins, M.D.   Mr Shoulder Right Wo Contrast  10/25/2011  *RADIOLOGY REPORT*  Clinical Data: Right shoulder and arm pain.  MRI OF THE RIGHT SHOULDER WITHOUT CONTRAST  Technique:  Multiplanar, multisequence MR imaging was performed. No intravenous contrast was administered.  Comparison: None  Findings: Examination is limited by patient motion.  No acute bony findings or destructive bony lesions.  The glenohumeral and AC joints are maintained.  There are mild AC joint degenerative changes.  Multi septated cystic structure in the greater tuberosity is most likely an interosseous ganglion.  The acromion is type 2 in shape.  Mild anterior and lateral downsloping.  No definite undersurface spurring change.  There is significant infraspinatus and supraspinatus tendinopathy/tendinosis with shallow bursal, articular surface and interstitial tears.  No discrete full-thickness retracted rotator cuff tear.  The subscapularis tendon is intact.  The long head biceps tendon is intact.  The glenoid labra are grossly normal. Mild subacromial/subdeltoid bursitis.  IMPRESSION:  1.  Limited examination by patient motion. 2.  Significant rotator cuff tendinopathy/tendinosis with infraspinatus and supraspinatus bursal, articular surface and interstitial tears but no discrete full-thickness retracted tear. 3.  Intact biceps tendon and grossly normal glenoid labra. 4.  Mild subacromial/subdeltoid bursitis. 5.  No worrisome bone lesions or destructive bony  process.  Original Report Authenticated By: P. Loralie Champagne, M.D.    Assessment/Plan Principal Problem:  *Bursitis of shoulder, right - chronic in nature; orthopedics consulted and recommendation was for no intervention needed at this time; patient should follow up in an outpatient clinic for management of chronic pain.  Active Problems:  Pain of right upper arm - likely secondary to bursitis; patient has refused any type of intervention from orthopedics point such as corticosteroid injections. Patient is alert awake and oriented to place person and time.   HTN (hypertension) - continue home meds   COPD (chronic obstructive pulmonary disease) - continue nebulizers as needed   Time Spent on Admission: Greater than 30 minutes  Synia Douglass 10/26/2011, 2:42 PM

## 2011-11-19 ENCOUNTER — Ambulatory Visit: Payer: Self-pay | Admitting: Rheumatology

## 2012-02-11 ENCOUNTER — Inpatient Hospital Stay: Payer: Self-pay | Admitting: Internal Medicine

## 2012-02-11 LAB — URINALYSIS, COMPLETE
Ketone: NEGATIVE
Nitrite: NEGATIVE
Ph: 7 (ref 4.5–8.0)
Protein: NEGATIVE
RBC,UR: NONE SEEN /HPF (ref 0–5)
Specific Gravity: 1.003 (ref 1.003–1.030)
Squamous Epithelial: <1

## 2012-02-11 LAB — PRO B NATRIURETIC PEPTIDE: B-Type Natriuretic Peptide: 1433 pg/mL — ABNORMAL HIGH (ref 0–450)

## 2012-02-11 LAB — CBC
HCT: 41.6 % (ref 35.0–47.0)
MCH: 32.6 pg (ref 26.0–34.0)
MCV: 96 fL (ref 80–100)
Platelet: 253 10*3/uL (ref 150–440)

## 2012-02-11 LAB — COMPREHENSIVE METABOLIC PANEL
Alkaline Phosphatase: 89 U/L (ref 50–136)
Bilirubin,Total: 0.5 mg/dL (ref 0.2–1.0)
Creatinine: 0.53 mg/dL — ABNORMAL LOW (ref 0.60–1.30)
EGFR (African American): >60
Glucose: 95 mg/dL (ref 65–99)
Osmolality: 263 (ref 275–301)
Potassium: 3.9 mmol/L (ref 3.5–5.1)
SGOT(AST): 19 U/L (ref 15–37)
SGPT (ALT): 16 U/L
Sodium: 133 mmol/L — ABNORMAL LOW (ref 136–145)
Total Protein: 7.4 g/dL (ref 6.4–8.2)

## 2012-02-11 LAB — CK TOTAL AND CKMB (NOT AT ARMC): CK, Total: 28 U/L (ref 21–215)

## 2012-02-12 LAB — LIPID PANEL: Triglycerides: 67 mg/dL (ref 0–200)

## 2012-02-12 LAB — CBC WITH DIFFERENTIAL/PLATELET
Basophil %: 0.2 %
Eosinophil %: 0.1 %
HGB: 13.3 g/dL (ref 12.0–16.0)
Lymphocyte #: 1.6 10*3/uL (ref 1.0–3.6)
Lymphocyte %: 16.7 %
MCHC: 33.1 g/dL (ref 32.0–36.0)
Monocyte #: 0.9 10*3/uL — ABNORMAL HIGH (ref 0.0–0.7)
Platelet: 254 10*3/uL (ref 150–440)
RBC: 4.13 10*6/uL (ref 3.80–5.20)

## 2012-02-12 LAB — BASIC METABOLIC PANEL
Calcium, Total: 8.7 mg/dL (ref 8.5–10.1)
Chloride: 94 mmol/L — ABNORMAL LOW (ref 98–107)
EGFR (African American): >60
EGFR (Non-African Amer.): >60
Glucose: 105 mg/dL — ABNORMAL HIGH (ref 65–99)

## 2012-02-13 LAB — BASIC METABOLIC PANEL
BUN: 12 mg/dL (ref 7–18)
Creatinine: 0.58 mg/dL — ABNORMAL LOW (ref 0.60–1.30)
Glucose: 118 mg/dL — ABNORMAL HIGH (ref 65–99)
Sodium: 130 mmol/L — ABNORMAL LOW (ref 136–145)

## 2012-02-13 LAB — CBC WITH DIFFERENTIAL/PLATELET
Lymphocyte %: 6.1 %
MCH: 32.5 pg (ref 26.0–34.0)
Monocyte %: 7.8 %
Neutrophil #: 10.8 10*3/uL — ABNORMAL HIGH (ref 1.4–6.5)
Platelet: 270 10*3/uL (ref 150–440)
RBC: 4.12 10*6/uL (ref 3.80–5.20)

## 2012-02-14 LAB — CBC WITH DIFFERENTIAL/PLATELET
Basophil #: 0 10*3/uL (ref 0.0–0.1)
Basophil %: 0.3 %
Eosinophil %: 0.3 %
Lymphocyte #: 1 10*3/uL (ref 1.0–3.6)
Lymphocyte %: 13.6 %
MCV: 97 fL (ref 80–100)
Monocyte %: 8.6 %
Neutrophil #: 5.5 10*3/uL (ref 1.4–6.5)
Neutrophil %: 77.2 %

## 2012-02-14 LAB — BASIC METABOLIC PANEL
Anion Gap: 7 (ref 7–16)
Calcium, Total: 8.4 mg/dL — ABNORMAL LOW (ref 8.5–10.1)
Chloride: 87 mmol/L — ABNORMAL LOW (ref 98–107)
Co2: 33 mmol/L — ABNORMAL HIGH (ref 21–32)
Creatinine: 0.54 mg/dL — ABNORMAL LOW (ref 0.60–1.30)
Glucose: 105 mg/dL — ABNORMAL HIGH (ref 65–99)
Potassium: 3.9 mmol/L (ref 3.5–5.1)

## 2012-02-15 LAB — BASIC METABOLIC PANEL
BUN: 8 mg/dL (ref 7–18)
Calcium, Total: 8.6 mg/dL (ref 8.5–10.1)
Creatinine: 0.46 mg/dL — ABNORMAL LOW (ref 0.60–1.30)
EGFR (African American): >60
EGFR (Non-African Amer.): >60
Osmolality: 261 (ref 275–301)
Potassium: 4.5 mmol/L (ref 3.5–5.1)
Sodium: 131 mmol/L — ABNORMAL LOW (ref 136–145)

## 2012-02-15 LAB — CBC WITH DIFFERENTIAL/PLATELET
Basophil %: 0.2 %
Eosinophil #: 0 10*3/uL (ref 0.0–0.7)
HCT: 38.1 % (ref 35.0–47.0)
MCH: 32.5 pg (ref 26.0–34.0)
MCV: 97 fL (ref 80–100)
Monocyte %: 9.1 %
RBC: 3.93 10*6/uL (ref 3.80–5.20)

## 2012-02-16 LAB — BASIC METABOLIC PANEL
Anion Gap: 4 — ABNORMAL LOW (ref 7–16)
Co2: 35 mmol/L — ABNORMAL HIGH (ref 21–32)
Creatinine: 0.45 mg/dL — ABNORMAL LOW (ref 0.60–1.30)
Glucose: 93 mg/dL (ref 65–99)
Potassium: 4.2 mmol/L (ref 3.5–5.1)

## 2012-02-17 LAB — BASIC METABOLIC PANEL
BUN: 10 mg/dL (ref 7–18)
Calcium, Total: 8.3 mg/dL — ABNORMAL LOW (ref 8.5–10.1)
Co2: 34 mmol/L — ABNORMAL HIGH (ref 21–32)
Creatinine: 0.51 mg/dL — ABNORMAL LOW (ref 0.60–1.30)
EGFR (African American): >60
EGFR (Non-African Amer.): >60
Glucose: 96 mg/dL (ref 65–99)
Osmolality: 271 (ref 275–301)
Sodium: 136 mmol/L (ref 136–145)

## 2012-02-17 LAB — CULTURE, BLOOD (SINGLE)

## 2012-03-11 ENCOUNTER — Observation Stay: Payer: Self-pay | Admitting: Internal Medicine

## 2012-03-11 ENCOUNTER — Ambulatory Visit: Payer: Self-pay | Admitting: Orthopaedic Surgery

## 2012-03-11 LAB — URINALYSIS, COMPLETE
Bilirubin,UR: NEGATIVE
Glucose,UR: NEGATIVE mg/dL (ref 0–75)
Nitrite: NEGATIVE
Ph: 8 (ref 4.5–8.0)
Specific Gravity: 1.009 (ref 1.003–1.030)
Squamous Epithelial: NONE SEEN
WBC UR: 3 /HPF (ref 0–5)

## 2012-03-11 LAB — TROPONIN I: Troponin-I: <0.02 ng/mL

## 2012-03-11 LAB — COMPREHENSIVE METABOLIC PANEL
Alkaline Phosphatase: 98 U/L (ref 50–136)
BUN: 10 mg/dL (ref 7–18)
Bilirubin,Total: 0.2 mg/dL (ref 0.2–1.0)
Chloride: 98 mmol/L (ref 98–107)
Creatinine: 0.81 mg/dL (ref 0.60–1.30)
EGFR (African American): >60
Glucose: 144 mg/dL — ABNORMAL HIGH (ref 65–99)
SGOT(AST): 24 U/L (ref 15–37)
SGPT (ALT): 27 U/L
Sodium: 136 mmol/L (ref 136–145)

## 2012-03-11 LAB — CBC
HCT: 43.5 % (ref 35.0–47.0)
MCHC: 32.7 g/dL (ref 32.0–36.0)
RBC: 4.42 10*6/uL (ref 3.80–5.20)

## 2012-03-11 IMAGING — CR PELVIS - 1-2 VIEW
1 series · 1 of 1 positions shown · non-contrast
Comparison: none

REASON FOR EXAM: left hip pain
COMMENTS:

[t pelvis ap]
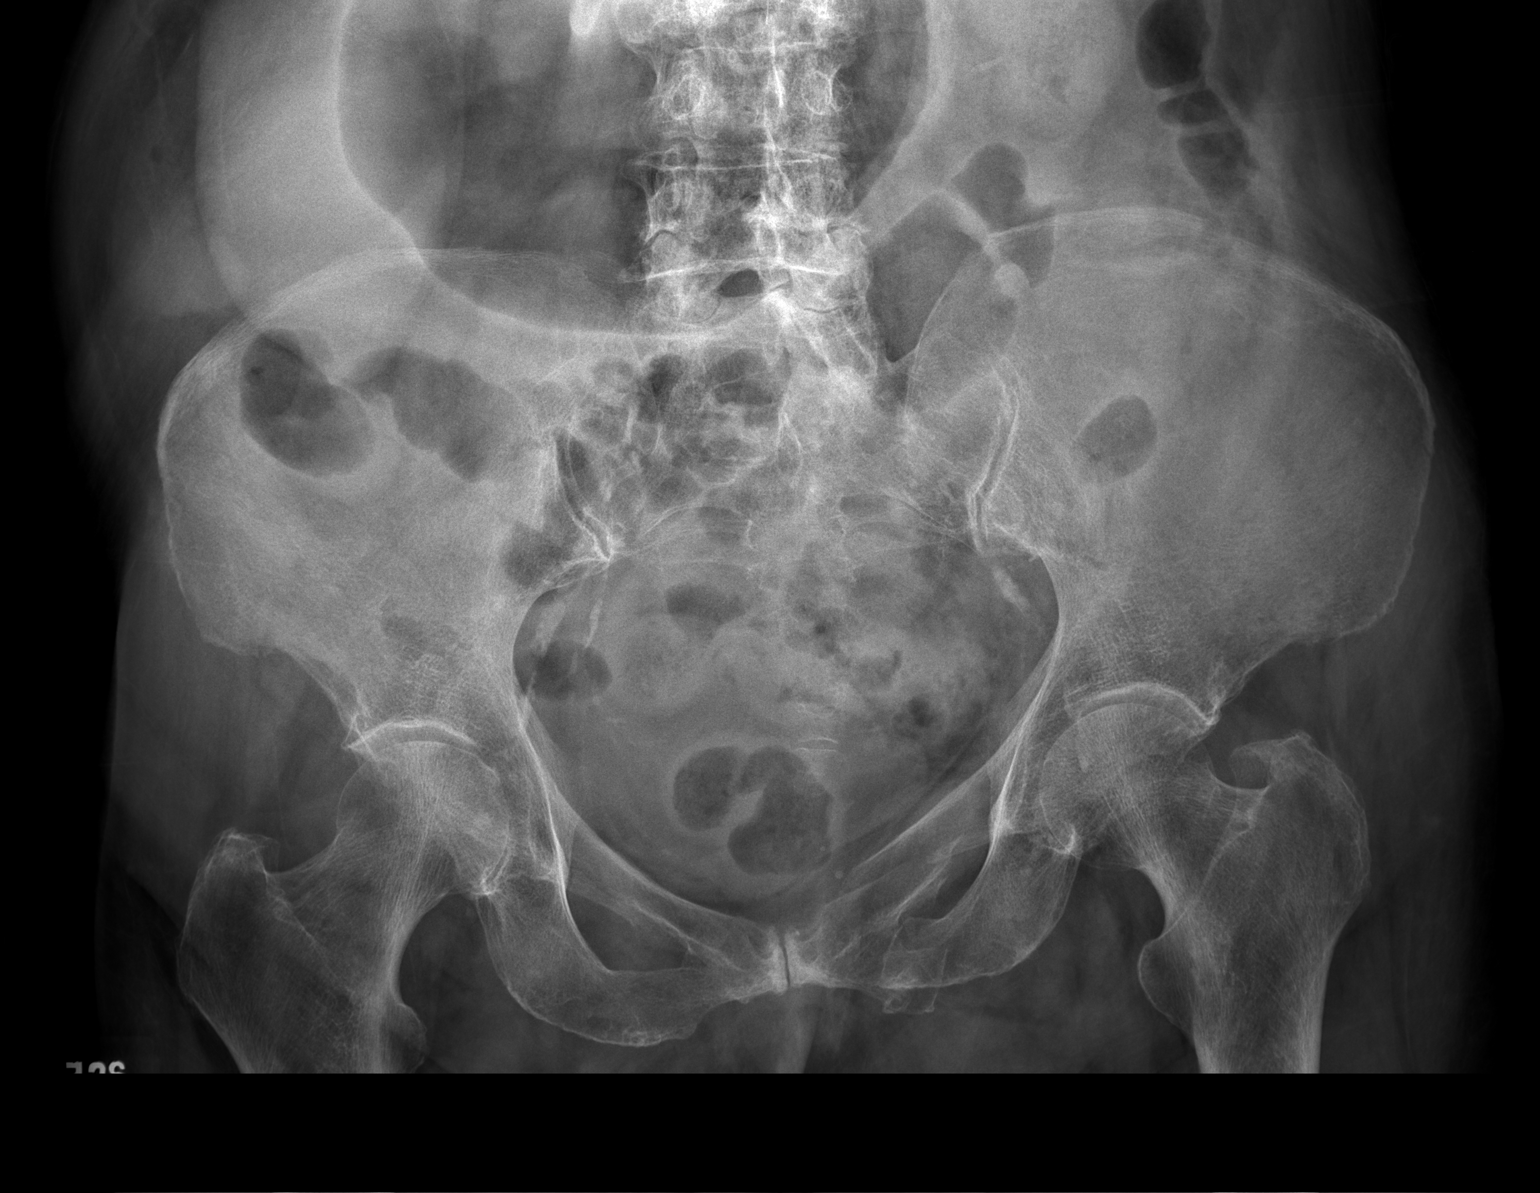

[1 of 1 positions shown; findings below may reference images not displayed]

PROCEDURE:     DXR - DXR PELVIS AP ONLY  - [DATE]  [DATE]

RESULT:     The bony pelvis is osteopenic. There are fractures of the pubic
rami on the left with some overlap. There is cortical disruption over the
left iliac wing suspicious for underlying fracture. The right hemipelvis is
grossly intact. There are degenerative changes of both hips.
IMPRESSION: The patient has sustained fractures of the pubic rami on
the left as well as of the left iliac bone.

[REDACTED]

## 2012-03-12 LAB — CBC WITH DIFFERENTIAL/PLATELET
Basophil %: 0.2 %
Lymphocyte #: 1.6 10*3/uL (ref 1.0–3.6)
Lymphocyte %: 18.5 %
MCHC: 33.9 g/dL (ref 32.0–36.0)
Neutrophil #: 6.2 10*3/uL (ref 1.4–6.5)
RDW: 13.4 % (ref 11.5–14.5)

## 2012-03-31 ENCOUNTER — Emergency Department: Payer: Self-pay | Admitting: Emergency Medicine

## 2012-04-01 LAB — CBC
HGB: 11.9 g/dL — ABNORMAL LOW (ref 12.0–16.0)
MCH: 31.3 pg (ref 26.0–34.0)
Platelet: 371 10*3/uL (ref 150–440)
WBC: 11.2 10*3/uL — ABNORMAL HIGH (ref 3.6–11.0)

## 2012-04-01 LAB — CK TOTAL AND CKMB (NOT AT ARMC): CK-MB: 0.9 ng/mL (ref 0.5–3.6)

## 2012-04-01 LAB — URINALYSIS, COMPLETE
Bacteria: NONE SEEN
Blood: NEGATIVE
Glucose,UR: NEGATIVE mg/dL (ref 0–75)
Ketone: NEGATIVE
Nitrite: NEGATIVE
RBC,UR: <1 /HPF (ref 0–5)
Squamous Epithelial: 1
WBC UR: 7 /HPF (ref 0–5)

## 2012-04-01 LAB — BASIC METABOLIC PANEL
Anion Gap: 8 (ref 7–16)
Co2: 30 mmol/L (ref 21–32)
Creatinine: 0.63 mg/dL (ref 0.60–1.30)
EGFR (African American): >60
Sodium: 135 mmol/L — ABNORMAL LOW (ref 136–145)

## 2012-04-01 LAB — TROPONIN I: Troponin-I: <0.02 ng/mL

## 2012-05-20 ENCOUNTER — Emergency Department: Payer: Self-pay | Admitting: Emergency Medicine

## 2012-09-09 DEATH — deceased

## 2015-03-03 NOTE — H&P (Signed)
PATIENT NAME:  [name] Meyer, [name] Meyer MR#:  [id number] DATE OF BIRTH:  Sep 02, 1925  DATE OF ADMISSION:  02/11/2012  PRIMARY CARE PHYSICIAN: Lisette Grinder III, MD  REFERRING PHYSICIAN: Lisa Roca, MD  CHIEF COMPLAINT: Recurrent falls, shortness of breath, coughing.   HISTORY OF PRESENT ILLNESS: The patient is an 81 year old white female with history of smoking since the age of 80 who is brought to the hospital with recurrent falls. She says she has fallen multiple times over the past few months and the falls have increased in frequency. Also, the patient has had a chronic cough which is productive, but now has worsened and it is more raspy in nature. Also, the family notes that she gets more short of breath at nighttime and there is also some wheezing. The patient denies any shortness of breath or wheezing. She also denies any shortness of breath at night, but her daughter reports that she has been experiencing these symptoms. The patient also denies any chest pain or palpitations. There has not been any fevers. She has also had loss of appetite and is not eating much. The patient denies any nocturnal dyspnea, however, the family reports that she gets short of breath and wakes up. She also has had swelling in her ankles. She denies any abdominal pain, nausea, vomiting, or diarrhea and no urinary frequency, urgency, or hesitancy.   PAST MEDICAL HISTORY:  1. Osteoarthritis.  2. History of melanoma status post resection, right upper arm.  3. Hypertension.  4. Chronic left foot pain.   PAST SURGICAL HISTORY:  1. Status post cataract surgery.  2. Status post hysterectomy. 3. Appendectomy. 4. Left foot surgery.  5. Cholecystectomy.  6. Right upper arm melanoma resection.  ALLERGIES: Penicillin, codeine, aspirin, NSAIDs, morphine, Darvocet.   CURRENT MEDICATIONS:  1. Benazepril 20 mg 1/2 tablet p.o. daily.  2. Cymbalta 30 mg daily.  3. Gabapentin 600 mg 1 tab p.o. twice a day.  SOCIAL HISTORY: She has  smoked about 1 pack per day since the age of 56 years. She drinks occasionally.   FAMILY HISTORY: Positive for hypertension.  REVIEW OF SYSTEMS: CONSTITUTIONAL: Denies any fevers. Complains of fatigue, weakness, chronic leg pain, and right arm pain. No weight loss. No weight gain. EYES: No blurred or double vision. No pain. No redness. No inflammation. No glaucoma. No cataracts. ENT: No tinnitus. No ear pain. No hearing loss. No seasonal or year-round allergies. No epistaxis. No nasal discharge. No postnasal drip. No sinus pain. No dentures. No redness of the oropharynx. No difficulty swallowing. RESPIRATORY: Complains of cough. No wheezing. No hemoptysis. Has dyspnea. No diagnosis of chronic obstructive pulmonary disease. No tuberculosis. No pneumonia. CARDIOVASCULAR: No chest pain. No orthopnea. Does have some ankle edema. No arrhythmias. No palpitations. Does have high blood pressure. GI: No nausea, vomiting, or diarrhea. No abdominal pain. No hematemesis. No melena. No gastroesophageal reflux disease. No irritable bowel syndrome. GU: Denies any dysuria, hematuria, renal calculus, or frequency. ENDOCRINE: Denies any polydipsia, nocturia, or thyroid problems. HEME/LYMPH: Denies anemia, easy bruisability, or bleeding. SKIN: No acne or rash. No changes in mole, hair, or skin. MUSCULOSKELETAL: No pain in the neck, back, or shoulder. NEURO: No numbness. No cerebrovascular accident. No transient ischemic attack. No seizures. PSYCHIATRIC: No anxiety. No insomnia.   PHYSICAL EXAMINATION:   VITAL SIGNS: Temperature 98.9, pulse 92, respirations 18, blood pressure 140/61, and O2 89% on room air.   GENERAL: The patient is an elderly white female currently not in any acute distress.  HEENT: Head atraumatic, normocephalic. Pupils are equal, round, and reactive to light and accommodation. Extraocular movements intact. Oropharynx is clear without any exudates. Ears show no erythema or drainage. Nasal examination  shows no ulceration or drainage.   NECK: No thyromegaly. No carotid bruits.   CARDIOVASCULAR: Regular rate and rhythm. No murmurs, rubs, clicks, or gallops. PMI is not displaced.   LUNGS: Diminished breath sounds without any rales, rhonchi, or wheezing.   ABDOMEN: Soft, nontender, and nondistended. Positive bowel sounds x4.   EXTREMITIES: 1+ edema at the ankle.   SKIN: No rash.   LYMPHATICS: No lymph nodes palpable.   MUSCULOSKELETAL: No erythema or swelling.   VASCULAR: Good DP and PT pulses.   NEURO: The patient is awake, alert, and oriented x3. No focal deficits.   PSYCHIATRIC: Not anxious or depressed.   EVALUATIONS: BNP 1433. Glucose 95, BUN 5, creatinine 0.53, sodium 133, potassium 3.9, chloride 95, CO2 29, and calcium 8.6. LFTs: Total protein 7.4, albumin 3.1, bilirubin total 0.5, alkaline phosphatase 89, AST 19, and ALT 16. Troponin less than 0.02. CPK 28. WBC 11.4, hemoglobin 14.1, and platelet count 253.   EKG: Normal sinus rhythm with left anterior fascicular block, septal infarct.   Chest x-ray shows increased interstitial markings which may be chronic. Possible low-grade congestive heart failure.  Ankle x-ray shows no evidence of bony abnormality. Very minimal degenerative changes.   CT scan of the head is without any acute abnormality.   ASSESSMENT AND PLAN: The patient is an 81 year old white female with history of smoking, hypertension, and osteoarthritis who presents with worsening cough, shortness of breath, and recurrent falls.  1. Cough and shortness of breath likely due to acute bronchitis: Chest x-ray with nonspecific changes, possibly chronic. May have chronic interstitial lung disease related to smoking. At this time, due to worsening cough and elevated WBC count, we will treat as bronchitis/pneumonia. I will treat her with IV Levaquin and get sputum cultures if possible. There is also some concern with the elevated BNP for possible low-grade congestive  heart failure. At this time, we will give her IV Lasix and follow her ins and outs. If renal function worsens then would stop the Lasix. We will check an echocardiogram. The patient may also have underlying chronic obstructive pulmonary disease. There is no current wheezing at this time to suggest acute chronic obstructive pulmonary disease exacerbation. I will place her on inhalers. If she starts wheezing and shortness of breath gets worse, we will consider steroids.  2. Recurrent falls: We will get PT evaluation and treatment.  3. Hypertension: We will continue enalapril.  4. Chronic leg pain: Continue Neurontin and Cymbalta, as taking at home.  5. Miscellaneous: We will place her on Lovenox for deep vein thrombosis prophylaxis.   TIME SPENT: 35 minutes. ____________________________ Lafonda Mosses Posey Pronto, MD shp:slb D: 02/11/2012 14:45:03 ET T: 02/11/2012 15:28:40 ET JOB#: [id number]  cc: Rolonda Pontarelli H. Posey Pronto, MD, <Dictator> John B. Sarina Ser, MD Alric Seton MD ELECTRONICALLY SIGNED 02/12/2012 12:19

## 2015-03-03 NOTE — Consult Note (Signed)
PATIENT NAME:  [name] Meyer, [name] Meyer MR#:  [id number] DATE OF BIRTH:  [birth year]  DATE OF CONSULTATION:  03/11/2012  REFERRING PHYSICIAN:   CONSULTING PHYSICIAN:  Donzetta Starch. Shanoah Asbill, MD  CHIEF COMPLAINT: Left hip and pelvic pain.   HISTORY OF PRESENT ILLNESS: The patient is an 81 year old female who currently lives by herself with the assistance of her daughter and her granddaughter as they are able to assist her. She experienced a fall sometime in the past 24 hours and was discovered on the floor of her home unable to stand or bear weight. The patient is somewhat confused and routinely confuses time sequence of events and has difficulty answering other questions regarding her medical history. However, she does not believe that she had any other associated injuries. She does not feel like she lost consciousness or struck her head during the fall. She currently expresses discomfort and pain in her left groin area, left pelvic area, and left buttock. She did experience a small laceration to the left hand. This was superficial in nature.  PAST MEDICAL HISTORY:  1. Arthritis. 2. Bilateral cataracts. 3. Status post hysterectomy. 4. History of right arm melanoma with resultant right upper extremity weakness. 5. Status post appendectomy. 6. Status post left foot surgery for plantar callus. 7. History of congestive heart failure. 8. Chronic obstructive pulmonary disease.  ALLERGIES: Penicillin, codeine, aspirin, NSAIDs, morphine, Darvocet.   MEDICATIONS: For complete list of medications, please see electronic medical record.   FAMILY HISTORY: Noncontributory.   SOCIAL HISTORY: The patient does not currently smoke or drink alcohol. She currently lives at home with the assistance of her daughter and her granddaughter on occasion.   REVIEW OF SYSTEMS: The patient denies any fevers, chills. She does have occasional cough. She denies abdominal pain, diarrhea, constipation, nausea, vomiting. She does have  occasional shortness of breath and dyspnea on exertion. She denies any current chest pain. She denies dysuria. She is tolerating a regular diet.   PHYSICAL EXAMINATION:   GENERAL: The patient is alert and oriented with periodic episodes of disorientation and confusion. She is in no acute distress.   HEENT: Pupils are equally round and react to light.   NECK: Supple.  RESPIRATORY: Mild wheezing.  CARDIOVASCULAR: Heart rate is regular.  ABDOMEN: Nontender.  GENITOURINARY: The patient has a Foley catheter in place.   LYMPHATIC: Negative for axillary lymphadenopathy. Negative for inguinal lymphadenopathy.   EXTREMITIES: Focused exam of the left upper extremity demonstrates bandaged superficial laceration to the left hand. AIN, PIN, and ulnar nerve functions are intact. Sensation is grossly intact throughout. Her hand is warm and well perfused. Focused exam of the left lower extremity demonstrates tenderness to palpation over the left iliac crest and painless range of motion of her hip. She is tender to palpation over the pubis. EHL, FHL dorsiflexion, plantar flexion, motor function are intact. Sensation is intact throughout the left lower extremity. Has 2+ her dorsalis pedis pulse. Foot is warm and well perfused without rash or ulceration.  NEUROLOGIC: The patient is able to follow commands but she does have some generalized right upper extremity weakness.   PSYCHIATRIC: The patient is alert. She is oriented to place but at times disoriented to time and sequence of events. She has somewhat poor insight into her overall situation at this time and appears somewhat anxious.   PERTINENT LABORATORY EVALUATIONS OBTAINED IN THE EMERGENCY DEPARTMENT: CBC with a white count of 13.7, hemoglobin 14.2, platelet count 227, glucose of 144, creatinine 0.81, otherwise normal electrolytes.  X-RAY FINDINGS: AP pelvis and AP and lateral of the left hip in combination with CT scans and reformats of the pelvis  demonstrate mildly displaced left superior and inferior rami fractures and a nondisplaced fracture through the left iliac wing and does not involve the acetabular joint surface and has not disrupted the left SI joint.   X-rays of the left hand are evaluated and demonstrate no acute bony abnormality.   ASSESSMENT: This is an 82 year old female with multiple medical problems as listed above status post mechanical fall with left pubic rami fractures and left iliac wing fractures.  PLAN: I discussed the patient's plan with her admitting primary team. Her injuries are deemed to be nonoperative at this time and she should continue to be as mobile as she is able with touchdown weightbearing to the left lower extremity and use of a walker. She will need directed physical therapy to encourage her in this process. She will need sufficient pain control and DVT prophylaxis will be at the discretion of the primary medical team. The patient is to follow-up with Dr. Marry Guan in the clinic for repeat evaluation and x-rays in approximately two weeks.   The patient and her family have been advised that these injuries will significantly affect her overall mobility and her independence. They expressed understanding of these facts and will work towards making arrangements for her continued care in the future.  ____________________________ Donzetta Starch Allean Montfort, MD cvs:drc D: 03/11/2012 21:36:13 ET T: 03/12/2012 09:37:40 ET JOB#: [id number]  cc: Donzetta Starch. Jovany Disano, MD, <Dictator> Margaret Pyle MD ELECTRONICALLY SIGNED 03/20/2012 22:03

## 2015-03-03 NOTE — Discharge Summary (Signed)
PATIENT NAME:  [name] Meyer, [name] Meyer MR#:  [id number] DATE OF BIRTH:  Jun 19, 1925  DATE OF ADMISSION:  02/11/2012 DATE OF DISCHARGE:  02/17/2012  HISTORY OF PRESENT ILLNESS: [name] Meyer is an 81 year old lady who was brought to the hospital by her family because of confusion and frequent falls. The patient's family had noted that she was becoming increasingly short of breath. She had a long history of tobacco abuse. In the emergency room, the patient was noted to be confused and relatively hypoxic. She was therefore admitted for further evaluation.   PAST MEDICAL HISTORY:  1. Hypertension.  2. Known osteoarthritis.  3. Melanoma resected in the distant past, from the right upper arm.  4. Recent development of reflex dystrophy, in the right upper extremity, for which she been evaluated at [facility]. 5. Coagulation disorder, she refused treatment in the past.   PAST SURGICAL HISTORY:  1. Hysterectomy.  2. Appendectomy.  3. Cholecystectomy.  4. Resection of a melanoma from her right arm.  5. Previous cataract surgery.  6. Some type of surgery on the left foot.   ALLERGIES: Penicillin, codeine, aspirin, NSAIDs, morphine, and Darvocet.   MEDICATIONS ON ADMISSION:  1. Benazepril 20 mg 1/2 tablet daily.  2. Cymbalta 30 mg daily.  3. Gabapentin 600 mg twice a day.  ADMISSION PHYSICAL EXAMINATION: Examination revealed a temperature of 98.9, pulse 92, respirations 18, and blood pressure 140/61. Oxygen saturation was 99% on room air. In general, this was an elderly lady who was confused but not necessarily agitated. Her color was good despite her pulse oximetry. Auscultation of the chest revealed diminished breath sounds bilaterally but no rales or rhonchi or wheezes. She was noted to have 1+ ankle edema. At the time of admission, she was felt to be alert and oriented.   LABS/STUDIES: Admission electrocardiogram showed a normal sinus rhythm with left anterior hemiblock.   Admission CBC showed a  hemoglobin of 14.1 with a hematocrit of 41.6. White count was 11,400. Platelet count was 253,000. Admission comprehensive metabolic panel showed a BUN of 5 with a creatinine of 0.53. Sodium was 133. Chloride was 95. Albumin was 3.1. BNP was 1433.   Admission urinalysis was within normal limits.   Blood cultures drawn on admission eventually showed no growth.   Chest x-ray showed increased interstitial markings that were felt to be chronic.   Unenhanced head CT showed no acute abnormality.   Routine radiographs of the left ankle were obtained due to the patient's recent fall and complaints of ankle pain, but no acute abnormalities were noted.   HOSPITAL COURSE: The patient was admitted to the regular medical floor where she was placed on telemetry. She was started on a pulmonary toilet of IV steroids, IV antibiotics, and SVNs. She was eventually seen in consultation by pulmonary, Dr. Chancy Milroy. Her hospital course was complicated by the development of acute atrial fibrillation, but the rate was controlled. She was not felt to be a good candidate for Coumadin because of her history of falls. During the patient's hospitalization, she developed acute delirium with agitation. The etiology was not clear, but was felt to be most likely due to hypoxia as the patient required supplemental oxygen throughout her hospitalization. She was seen in consultation by Behavioral Medicine because of noncompliance with recommendations and refusal to be transferred to a rehab facility or to go home with oxygen. She was declared incompetent by the consulting psychiatrist. It was recommended to the family that someone obtain guardianship. The patient's pulmonary status  did gradually improve. At the time of transfer her lung fields were clear to auscultation, but she was still requiring 2 liters of oxygen to keep her pulse oximetry above 90. Her son eventually came in from [place] and got her to agree to go to rehab. She was seen  during her hospitalization by physical therapy and was able to ambulate fairly long distances, but did show unsteadiness and poor balance.   DISCHARGE DIAGNOSES:  1. Acute on chronic respiratory failure.  2. Acute atrial fibrillation.  3. Tobacco abuse.  4. Underlying dementia with acute delirium.  5. 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   DISCHARGE MEDICATIONS:  1. Tylenol 650 mg every four hours p.r.n. pain or temperature.  2. Combivent 2 puffs every six hours p.r.n.  3. Benazepril 5 mg daily.  4. Cymbalta 30 mg daily.  5. Gabapentin 600 mg twice a day.  6. Mucinex 600 mg twice a day.  7. Robitussin 15 mL every six hours p.r.n.  8. Advair 250/50 one puff twice a day. 9. Levaquin 250 mg daily for an additional five days.  10. Prednisone taper.  11. Theophylline SR 100 mg daily.  12. Spiriva 1 puff daily.  13. Milk of Magnesia 30 mL daily p.r.n.  14. Nicotine oral inhaler kit.  15. Nicotine patch 14 mg transdermally daily.   DISCHARGE DISPOSITION: The patient is being discharged on a low sodium diet with activity as tolerated. She will be evaluated by physical therapy. She will be continued on supplemental oxygen. The patient is on a prednisone taper and it is hoped that she can eventually be weaned from oxygen so she can return home. Her prognosis however at this point is guarded due to her noncompliance.  ____________________________ Hewitt Blade. Sarina Ser, MD jbw:slb D: 02/17/2012 11:52:58 ET     T: 02/17/2012 12:02:25 ET         JOB#: [id number] cc: Jenny Reichmann B. Sarina Ser, MD, <Dictator> Lottie Mussel III MD ELECTRONICALLY SIGNED 02/18/2012 7:20
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
## 2015-03-03 NOTE — Consult Note (Signed)
Brief Consult Note: Diagnosis: left rami fractures and left illiac wing fracture.   Patient was seen by consultant.   Consult note dictated.   Discussed with Attending MD.   Comments: Non operative treatment given that fractures do not involve the articular portion of the pelvis and given her overal functional status/constellation of comorbidities. 1.  TDWB with walker 2. PT 3.  pain control 4.  DVT prophylaxis at the discretion of the primary team 5.  followup with Dr. Marry Guan in 2 weeks for repeat XR.  Electronic Signatures: Margaret Pyle (MD)  (Signed [phone] 21:22)  Authored: Brief Consult Note   Last Updated: 03-May-13 21:22 by Margaret Pyle (MD)

## 2015-03-03 NOTE — Discharge Summary (Signed)
PATIENT NAME:  [name] Meyer, [name] Meyer MR#:  [id number] DATE OF BIRTH:  09-25-1925  DATE OF ADMISSION:  03/11/2012 DATE OF DISCHARGE:  03/14/2012  HISTORY OF PRESENT ILLNESS: [name] Meyer is an 81 year old white lady who presented to the Emergency Room after a fall at home. The patient was found to have multiple pelvic fractures and was admitted for pain control.   PAST MEDICAL HISTORY:  1. Hypertension. 2. Oxygen-dependent chronic obstructive pulmonary disease. 3. Osteoarthritis. 4. Continued tobacco abuse.  5. Reflex dystrophy of the right arm.  6. Senile dementia. The patient had recently been admitted to the hospital because of senile failure to thrive. She was declared incompetent during that admission and was discharged to [facility]. Apparently she was able to sign herself out.   PAST SURGICAL HISTORY:  1. Hysterectomy.  2. Appendectomy.  3. Cholecystectomy.  4. Melanoma removed from her right upper arm in the distant past by Dr. Jamal Collin.  5. Previous cataract surgery.  6. Surgery on her left foot.   ALLERGIES: Aspirin, codeine, Darvocet, morphine, NSAIDs and penicillin.   MEDICATIONS ON ADMISSION:  1. Advair 250/50, one puff b.i.d.  2. Benazepril/hydrochlorothiazide 10 mg/12 .5 mg daily.  3. Cymbalta 30 mg daily.  4. Gabapentin 600 mg b.i.d.  5. Spiriva one puff daily.  6. Theophylline 100 mg daily.  7. Advair Diskus 250/50, one puff b.i.d.   ADMISSION PHYSICAL EXAMINATION: (As described by the admitting physician). Temperature of 97.9. Blood pressure 115/60 and a pulse of 85 and oxygen saturation of 93% on room air. The patient's admission examination was relatively unremarkable except for her reflex dystrophy on the right. She also had great difficulty with pain in the pelvis, particularly on the left.   LABORATORY, DIAGNOSTIC AND RADIOLOGIC DATA: Admission CBC showed a hemoglobin of 14.2 with a white count of 13,700. Platelet count was 227,000. Admission chemistry panel showed a  random blood sugar of 144 but was otherwise unremarkable. Admission urinalysis showed 30 mg/dL of protein, but was otherwise unremarkable. Admission pelvic series showed fractures of the pubic rami on the left as well as of the left iliac bone. CT scan of the head showed no acute abnormalities. There were age related atrophic changes diffusely which were unchanged. CT scan of the abdomen and pelvis showed an acute fracture of the iliac wing on the left. There was surrounding hematoma within the ileus muscle. There was an acute fracture of the inferior pubic ramus on the left. The remainder of the study was relatively unremarkable.   HOSPITAL COURSE: The patient was admitted to the regular medical floor where she was placed at bed rest because of her rather severe pelvic pain. Because of her multiple drug intolerances, she was placed on tramadol as needed for pain relief. The patient was seen in consultation by orthopedics who recommended medical management. She was also seen by physical therapy. Long-term placement was recommended by physical therapy and also by social services.   DISCHARGE DIAGNOSES:  1. Pelvic fracture.  2. Oxygen dependent chronic obstructive pulmonary disease.  3. Continued tobacco abuse.  4. Hypertension.  5. Reflex dystrophy.  6. Osteoarthritis.  7. Senile dementia.  8. History of melanoma.  9. History of monoclonal gammopathy.   DISCHARGE MEDICATIONS:  1. Benazepril 5 mg daily.  2. Cymbalta 30 mg daily.  3. Advair 250/50, one puff b.i.d.  4. Gabapentin 600 mg twice a day.  5. Protonix 40 mg daily.  6. Theophylline SR 100 mg daily.  7. Spiriva 1 puff daily.  8. Benzonatate capsule 100 mg t.i.d.  9. Mucinex 600 mg twice a day.  10. Tramadol 50 mg every six hours as needed for pain.  11. Acetaminophen ES 500 mg every six hours as needed for pain.  12. Alprazolam 0.25 mg twice a day.  13. Haldol 2 mg every six hours p.r.n. agitation.   DISCHARGE DISPOSITION: The  patient is being discharged on a 2 gram sodium diet with activity as tolerated. She is also being discharged on oxygen at 2 L/min. The patient will continue to receive physical therapy. It is unclear at this point whether or not the patient will return home or not.   At this point, the patient remains a FULL CODE.  ____________________________ Hewitt Blade. Sarina Ser, MD jbw:ap D: 03/14/2012 07:23:56 ET T: 03/14/2012 07:58:17 ET JOB#: [id number]  cc: Hewitt Blade. Sarina Ser, MD, <Dictator> Lottie Mussel III MD ELECTRONICALLY SIGNED 03/14/2012 20:17

## 2015-03-03 NOTE — H&P (Signed)
PATIENT NAME:  [name], [name] Meyer#:  [id number] DATE OF BIRTH:  1924-11-28  DATE OF ADMISSION:  03/11/2012  PRIMARY CARE PHYSICIAN:  Lisette Grinder, III, MD  REFERRING PHYSICIAN: Dr.  Roxine Caddy.   CHIEF COMPLAINT: Fall today.   HISTORY OF PRESENT ILLNESS: An 80 year old Caucasian female with a history of osteoarthritis and hypertension, chronic obstructive pulmonary disease, history of melanoma status post resection on the right arm, and frequent falls, was sent to ED after a fall. The patient is alert, awake, oriented. According to her and her family the patient fell by accident today and felt pelvic pain but did not have loss of consciousness, syncope, no incontinence, slurred speech, or weakness. The patient was found to have a pelvic fracture but she does not need surgery due closed fracture so [facility] was called for admission to Internal Medicine.   PAST MEDICAL HISTORY: OA, COPD, hypertension, chronic left foot pain, recurrent falls.   PAST SURGICAL HISTORY: Status post cataract surgery, status post hysterectomy, appendectomy, left foot surgery, cholecystectomy, right upper arm melanoma resection.   SOCIAL HISTORY: Smokes 1 pack a day since 81 years old. Denies any alcohol or illicit drugs.   FAMILY HISTORY: Hypertension.   ALLERGIES: Aspirin, codeine, Darvocet, morphine, NSAIDs, penicillin.    HOME MEDICATIONS:  1. Advair 250/50 mcg 1 puff inhalation twice daily.  2. Benazepril 10 mg/0.5 mg p.o. daily.  3. Cymbalta 30 mg p.o. daily.  4. Gabapentin 600 mg p.o. b.i.d.  5. Milk of magnesium 30 mL once daily p.r.n. for constipation.  6. Spiriva 18 mcg inhalation 1 capsule once daily.  7. Theophylline SR 100 mg p.o. daily.  8. Tylenol 325 mg p.o. 2 tablets p.o. q.4. hours p.r.n.  REVIEW OF SYSTEMS: CONSTITUTIONAL: The patient denies any fever or chills. No headache or dizziness. No weakness. EYES: No double vision, blurred vision, but has cataract status post surgery. EENT:  No discharge from ear or nose. No epistaxis or postnasal drip. No slurred speech or dysphagia. RESPIRATORY: No cough, sputum, shortness of breath or hemoptysis. GASTROINTESTINAL: No abdominal pain, nausea, vomiting, or diarrhea. No melena or bloody stool. CARDIOVASCULAR: No chest pain, palpitation, orthopnea, or nocturnal dyspnea. GU: No dysuria, hematuria, or incontinence. SKIN: No rash or jaundice. HEMATOLOGY: No easy bruising or bleeding. MUSCULOSKELETAL: Pelvic pain and leg pain but no edema. NEUROLOGY: No syncope, loss of consciousness or seizure.   PHYSICAL EXAMINATION:  VITAL SIGNS: Temperature 97.9, blood pressure 115/60, pulse 85, oxygen saturation 93% in room air, respirations 20.   GENERAL: The patient is alert, awake, oriented, in no acute distress.   HEENT: Pupils are round, equal, reactive to light, accommodation. Moist oral mucosa.  Clear oropharynx.     NECK: Supple. No JVD or carotid bruits. No lymphadenopathy. No thyromegaly.   CARDIOVASCULAR: S1 and S2 regular rate and rhythm. No murmurs or gallops.   PULMONARY: Bilateral air entry. No wheezing or rales   ABDOMEN: Soft. No distention or tenderness. No organomegaly. Bowel sounds present.   EXTREMITIES: No edema, clubbing, or cyanosis. No calf tenderness. The patient cannot move her legs due to pelvic pain.   SKIN: No rash or jaundice.   NEUROLOGY: Alert and oriented x3.  No focal deficit. Sensation intact.   LABORATORY DATA: WBC 13.7, hemoglobin 14.2, platelet 227,000. Glucose 144, BUN 10, creatinine 0.81. Electrolytes are normal. Troponin less than 0.02. CAT scan of head: No evidence of acute ischemia or hemorrhage or infarction. CAT scan of cervical: No evidence of spine fracture or dislocation.  Pelvis x-ray shows fracture of pubic rami on the left as well as left iliac bone. Hip x-ray shows the same. Hand x-ray:  Degenerative changes of left hand, no fracture.    IMPRESSION:  1. Frequent falls.  2. Ramus iliac  fracture.    3. Leukocytosis, possibly due to reaction.  4. Chronic obstructive pulmonary disease, stable 5. Hypertension, controlled.  6. Osteoarthritis.   PLAN OF TREATMENT:  1. Patient will be placed for observation. We will get an orthopedic surgery consult and a PT evaluation.  2. We will follow up CBC for leukocytosis.  3. We will continue hypertension medication, benazepril.    4. We will continue Advair, Spiriva and Theophylline for chronic obstructive pulmonary disease.  5. Gastroenterology and deep vein thrombosis prophylaxis.  6. Discussed patient's situation and plan of treatment with the patient's daughter, granddaughter, and the case Freight forwarder.   TIME SPENT: About 60 minutes.      ____________________________ Demetrios Loll, MD qc:vtd D: 03/11/2012 20:50:28 ET T: 03/12/2012 06:52:31 ET JOB#: [id number]  cc: Demetrios Loll, MD, <Dictator> John B. Sarina Ser, MD Demetrios Loll MD ELECTRONICALLY SIGNED 03/14/2012 22:03

## 2015-03-03 NOTE — Consult Note (Signed)
PATIENT NAME:  [name] Meyer, [name] Meyer MR#:  [id number] DATE OF BIRTH:  Nov 29, 1924  DATE OF CONSULTATION:  02/12/2012  REFERRING PHYSICIAN:   CONSULTING PHYSICIAN:  Isaias Cowman, MD PRIMARY CARE PHYSICIAN:  Dr Gilford Rile  CHIEF COMPLAINT: Shortness of breath.   HISTORY OF PRESENT ILLNESS: The patient is an 81 year old female who has a 70-pack-year tobacco abuse history who has fallen several times during the past several months. The patient presents with shortness of breath and cough and was admitted for further evaluation. She has ruled out for myocardial infarction by CPK isoenzymes and troponin. EKG has shown intermittent atrial fibrillation with controlled rate. The patient currently is in sinus rhythm. She denies chest pain. Echocardiogram was performed which revealed normal left ventricular function. The patient did have pedal edema which responded to intravenous Lasix.   PAST MEDICAL HISTORY:  1. Chronic obstructive pulmonary disease.  2. Status post resection of melanoma right upper arm.  3. Hypertension.     MEDICATIONS:  1. Benazepril 10 mg daily.  2. Cymbalta 30 mg daily.  3. Gabapentin 600 mg b.i.d.   SOCIAL HISTORY: The patient currently lives alone. She has smoked at least a pack of cigarettes a day for 70 years, at times 3 packs a day.   FAMILY HISTORY: Noncontributory for myocardial infarction.   REVIEW OF SYSTEMS. CONSTITUTIONAL: No fever or chills. EYES: No blurry vision. EARS: No hearing loss. RESPIRATORY: Shortness of breath and cough as stated above.  CARDIOVASCULAR: The patient denies chest pain. GI: The patient does have some nausea. Denies vomiting, diarrhea, or constipation. GU: No dysuria or hematuria. ENDOCRINE: No polyuria or polydipsia. HEMATOLOGICAL: No easy bruising or bleeding. MUSCULOSKELETAL: No arthralgias or myalgias. NEUROLOGICAL: No focal muscle weakness or numbness. PSYCHIATRIC: No depression or anxiety.   PHYSICAL EXAMINATION:  VITAL SIGNS: Blood  pressure 131/66, pulse 88, respirations 18, temperature 97.4, pulse oximetry 98%.   HEENT: Pupils equal, reactive to light and accommodation.   NECK: Supple without thyromegaly.   LUNGS: Decreased breath sounds in both lung fields.   CARDIOVASCULAR: Normal jugular venous pressure. Normal point of maximal impulse. Regular rate and rhythm. Normal S1, S2. No appreciable gallop, murmur, or rub.   ABDOMEN: Soft, nontender. Pulses were intact bilaterally.   MUSCULOSKELETAL: Normal muscle tone.   NEUROLOGIC: The patient was alert and oriented x3. Motor and sensory both grossly intact.   IMPRESSION: An 81 year old female with longstanding tobacco abuse history, probable chronic obstructive pulmonary disease, who presents with probable bronchitis and possible pneumonia with mild fluid retention. The patient has normal left ventricular function by echocardiogram and has ruled out for myocardial infarction by cardiac biomarkers. EKG did show intermittent atrial fibrillation, currently in sinus rhythm.   ALLERGIES:  The patient is allergic to aspirin.    Is at risk for falling with frequent falls.   RECOMMENDATIONS:  1. Agree with overall current therapy.  2. Defer full dose anticoagulation.        3. Would defer chronic anticoagulation with warfarin or one of the new anticoagulants in light of her frequent falls.  4. Would defer any further cardiac workup at this time.     ____________________________ Isaias Cowman, MD ap:vtd D: 02/12/2012 16:49:29 ET T: 02/13/2012 08:30:01 ET JOB#: [id number]  cc: Isaias Cowman, MD, <Dictator> Isaias Cowman MD ELECTRONICALLY SIGNED 02/24/2012 8:46

## 2015-03-03 NOTE — Consult Note (Signed)
Brief Consult Note: Diagnosis: Adjustment disorder with anxiety.   Patient was seen by consultant.   Consult note dictated.   Recommend further assessment or treatment.   Orders entered.   Discussed with Attending MD.   Comments: [name] Meyer has no h/o mental; illness. Her dayghters report mild cognitive decline. She has been maintained in Cymbalta for chronic pain per family. The patient was admitted for SOB. She refused SNF participation. She will be discharged to home w/o oxygen if possible as she is a smoker.   MSE: Alert, oriented, irritable and argumentative. She seems not to remember me at all. She denies psychotic symptoms or suicidal/homicidal ideations.    PLAN: 1. Please continue Cymbalta.   2. Please continue nicotine patch and nicotrol inhaler.  3. I spent over 20 minutes talking to her two daughters, her sister and Transport planner. I encouraged the family to seek guardianship.   4. I will sign off.  Electronic Signatures: Orson Slick (MD)  (Signed 09-Apr-13 17:20)  Authored: Brief Consult Note   Last Updated: 09-Apr-13 17:20 by Orson Slick (MD)

## 2015-03-03 NOTE — Consult Note (Signed)
Brief Consult Note: Diagnosis: Adjustment disorder with anxiety.   Patient was seen by consultant.   Consult note dictated.   Recommend further assessment or treatment.   Comments: Ms. Loma has no h/o mental; illness. Her dayghters report mild cognitive decline. She has been maintained in Cymbalta for chronic pain per family. The patient was admitted for SOB and needs to be on O2 now. She is irritable and argumentative as she wants to return to home immediately. She refuses SNF participation and intends to continue to smoke. She does not believe she needs oxygen.   PLAN: 1. Please continue Cymbalta.   2. Will start nicotine patch and nicotrol inhaler.  3. I will follow up.  Electronic Signatures: Orson Slick (MD)  (Signed 08-Apr-13 13:37)  Authored: Brief Consult Note   Last Updated: 08-Apr-13 13:37 by Orson Slick (MD)
